# Patient Record
Sex: Male | Born: 1975 | Race: Black or African American | Hispanic: No | Marital: Single | Smoking: Current every day smoker
Health system: Southern US, Community
[De-identification: ages and names within clinical notes are randomized; demographics above are authoritative.]

## PROBLEM LIST (undated history)

## (undated) DIAGNOSIS — M549 Dorsalgia, unspecified: Secondary | ICD-10-CM

## (undated) DIAGNOSIS — F209 Schizophrenia, unspecified: Secondary | ICD-10-CM

---

## 1999-10-31 ENCOUNTER — Encounter: Payer: Self-pay | Admitting: Emergency Medicine

## 1999-10-31 ENCOUNTER — Emergency Department (HOSPITAL_COMMUNITY): Admission: EM | Admit: 1999-10-31 | Discharge: 1999-10-31 | Payer: Self-pay

## 1999-10-31 ENCOUNTER — Emergency Department (HOSPITAL_COMMUNITY): Admission: EM | Admit: 1999-10-31 | Discharge: 1999-10-31 | Payer: Self-pay | Admitting: *Deleted

## 2000-02-08 ENCOUNTER — Emergency Department (HOSPITAL_COMMUNITY): Admission: EM | Admit: 2000-02-08 | Discharge: 2000-02-08 | Payer: Self-pay | Admitting: Emergency Medicine

## 2000-02-10 ENCOUNTER — Emergency Department (HOSPITAL_COMMUNITY): Admission: EM | Admit: 2000-02-10 | Discharge: 2000-02-10 | Payer: Self-pay | Admitting: Emergency Medicine

## 2001-04-25 ENCOUNTER — Encounter: Payer: Self-pay | Admitting: Emergency Medicine

## 2001-04-25 ENCOUNTER — Emergency Department (HOSPITAL_COMMUNITY): Admission: EM | Admit: 2001-04-25 | Discharge: 2001-04-25 | Payer: Self-pay

## 2011-01-02 ENCOUNTER — Emergency Department (HOSPITAL_COMMUNITY)
Admission: EM | Admit: 2011-01-02 | Discharge: 2011-01-03 | Disposition: A | Discharge status: Home / Self Care | Payer: Self-pay | Attending: Emergency Medicine | Admitting: Emergency Medicine

## 2011-01-02 DIAGNOSIS — R369 Urethral discharge, unspecified: Secondary | ICD-10-CM | POA: Insufficient documentation

## 2011-01-02 DIAGNOSIS — L989 Disorder of the skin and subcutaneous tissue, unspecified: Secondary | ICD-10-CM | POA: Insufficient documentation

## 2011-01-02 DIAGNOSIS — Z202 Contact with and (suspected) exposure to infections with a predominantly sexual mode of transmission: Secondary | ICD-10-CM | POA: Insufficient documentation

## 2011-01-04 LAB — GC/CHLAMYDIA PROBE AMP, GENITAL
Chlamydia, DNA Probe: NEGATIVE
GC Probe Amp, Genital: NEGATIVE

## 2011-01-22 ENCOUNTER — Emergency Department (HOSPITAL_COMMUNITY): Payer: Self-pay

## 2011-01-22 ENCOUNTER — Emergency Department (HOSPITAL_COMMUNITY)
Admission: EM | Admit: 2011-01-22 | Discharge: 2011-01-22 | Disposition: A | Discharge status: Home / Self Care | Payer: Self-pay | Attending: Emergency Medicine | Admitting: Emergency Medicine

## 2011-01-22 DIAGNOSIS — IMO0001 Reserved for inherently not codable concepts without codable children: Secondary | ICD-10-CM | POA: Insufficient documentation

## 2011-01-22 DIAGNOSIS — J45909 Unspecified asthma, uncomplicated: Secondary | ICD-10-CM | POA: Insufficient documentation

## 2011-01-22 DIAGNOSIS — K5900 Constipation, unspecified: Secondary | ICD-10-CM | POA: Insufficient documentation

## 2011-01-22 DIAGNOSIS — R109 Unspecified abdominal pain: Secondary | ICD-10-CM | POA: Insufficient documentation

## 2011-01-22 LAB — URINALYSIS, ROUTINE W REFLEX MICROSCOPIC
Bilirubin Urine: NEGATIVE
Glucose, UA: NEGATIVE mg/dL
Ketones, ur: NEGATIVE mg/dL
Leukocytes, UA: NEGATIVE
Nitrite: NEGATIVE
Protein, ur: NEGATIVE mg/dL
Specific Gravity, Urine: 1.029 (ref 1.005–1.030)
Urobilinogen, UA: 0.2 mg/dL (ref 0.0–1.0)
pH: 5 (ref 5.0–8.0)

## 2011-01-22 LAB — URINE MICROSCOPIC-ADD ON

## 2011-01-22 MED ORDER — CYCLOBENZAPRINE HCL 10 MG PO TABS: 10.0000 mg | ORAL_TABLET | Freq: Two times a day (BID) | ORAL | Status: AC | PRN

## 2011-01-22 MED ORDER — HYDROCODONE-ACETAMINOPHEN 5-325 MG PO TABS
2.0000 | ORAL_TABLET | Freq: Once | ORAL | Status: AC
  Administered 2011-01-22: 2 via ORAL
  Filled 2011-01-22: qty 2

## 2011-01-22 MED ORDER — DOCUSATE SODIUM 100 MG PO CAPS: 100.0000 mg | ORAL_CAPSULE | Freq: Two times a day (BID) | ORAL | Status: AC

## 2011-01-22 NOTE — ED Notes (Signed)
Received patient and assumed care, pt presents with c/o left flank pain for three days, rates pain 10/10 radiating to left hip and groin area, pt medicated as order pending ct scan

## 2011-01-22 NOTE — ED Notes (Signed)
Patient presents with left flank pain and pressure x 3 days with genital pain. Patient denies urinary symptoms. Patient reports pain is worse with coughing.

## 2011-01-22 NOTE — ED Provider Notes (Signed)
History     CSN: [id number] Arrival date & time: 01/22/2011  9:39 AM   First MD Initiated Contact with Patient 01/22/11 1025      Chief Complaint  Patient presents with  . Flank Pain    (Consider location/radiation/quality/duration/timing/severity/associated sxs/prior treatment) Patient is a 35 y.o. male presenting with flank pain. The history is provided by the patient.  Flank Pain This is a new problem. The current episode started in the past 7 days. The problem occurs 2 to 4 times per day. The problem has been unchanged. Associated symptoms include myalgias. Pertinent negatives include no abdominal pain, arthralgias, change in bowel habit, chest pain, chills, coughing, fatigue, fever, joint swelling, nausea, rash, sore throat, urinary symptoms, vertigo, visual change, vomiting or weakness. The symptoms are aggravated by coughing. He has tried acetaminophen for the symptoms. The treatment provided mild relief.  In the last 3 days pt has had flank pain and some L groin pain that seems to spasm at times. He states that he had penile discharge 3 weeks ago that was treated and has not recurred. He denies frequency, urgency, pain with urination, blood in urine, no change in bowel habits. Pain is worse with coughing at times. States he has had decreased urine because he is not drinking much liquids during the day. No fevers, chills, abdominal pain, nausea, vomiting. No bulge in his groin with pain. No history of hernia. No history of back pain.   Past Medical History  Diagnosis Date  . Asthma     History reviewed. No pertinent past surgical history.  History reviewed. No pertinent family history.  History  Substance Use Topics  . Smoking status: Current Everyday Smoker  . Smokeless tobacco: Not on file  . Alcohol Use: Yes      Review of Systems  Constitutional: Negative for fever, chills, activity change, appetite change and fatigue.  HENT: Negative.  Negative for sore throat.     Respiratory: Negative for cough and shortness of breath.   Cardiovascular: Negative for chest pain, palpitations and leg swelling.  Gastrointestinal: Negative for nausea, vomiting, abdominal pain, diarrhea, constipation, blood in stool, abdominal distention and change in bowel habit.  Genitourinary: Positive for flank pain. Negative for dysuria, urgency, frequency, hematuria, decreased urine volume, discharge, penile swelling, scrotal swelling, enuresis, difficulty urinating, genital sores, penile pain and testicular pain.  Musculoskeletal: Positive for myalgias and back pain. Negative for joint swelling, arthralgias and gait problem.       Pt does not have tenderness of the spine, some flank pain on the L side. No signs of hernia on the L or the R side.   Skin: Negative for color change, rash and wound.  Neurological: Negative.  Negative for vertigo and weakness.  Hematological: Negative.   Psychiatric/Behavioral: Negative.   All other systems reviewed and are negative.    Allergies  Review of patient's allergies indicates no known allergies.  Home Medications  No current outpatient prescriptions on file.  BP 134/66  Pulse 106  Temp(Src) 97.9 F (36.6 C) (Oral)  Resp 20  SpO2 96%  Physical Exam  Vitals reviewed. Constitutional: He is oriented to person, place, and time. He appears well-developed and well-nourished.       In mild distress.  HENT:  Head: Normocephalic and atraumatic.  Eyes: EOM are normal. Pupils are equal, round, and reactive to light.  Neck: Normal range of motion. Neck supple.  Cardiovascular: Normal rate, regular rhythm and normal heart sounds.   No murmur heard.  Pulmonary/Chest: Effort normal and breath sounds normal. No respiratory distress. He has no wheezes. He has no rales.  Abdominal: Soft. Bowel sounds are normal. He exhibits no distension. There is no tenderness. There is no rebound.  Genitourinary: Penis normal. No penile tenderness.        Inguinal hernia check negative on the L and the R side.   Musculoskeletal: Normal range of motion.       Pt does not have pain to palpation but does have pain to percussion over the L flank. No spinal tenderness.   Neurological: He is alert and oriented to person, place, and time. He displays normal reflexes. No cranial nerve deficit.  Skin: Skin is warm and dry. No rash noted. No erythema. No pallor.    ED Course  Procedures (including critical care time)  Labs Reviewed  URINALYSIS, ROUTINE W REFLEX MICROSCOPIC - Abnormal; Notable for the following:    Hgb urine dipstick SMALL (*)    All other components within normal limits  URINE MICROSCOPIC-ADD ON - Abnormal; Notable for the following:    Bacteria, UA FEW (*)    All other components within normal limits   Ct Abdomen Pelvis Wo Contrast  01/22/2011  *RADIOLOGY REPORT*  Clinical Data: Rule  out kidney stone or inguinal hernia, flank pain  CT ABDOMEN AND PELVIS WITHOUT CONTRAST  Technique:  Multidetector CT imaging of the abdomen and pelvis was performed following the standard protocol without intravenous contrast.  Comparison: None.  Findings: Lung bases are unremarkable.  The sagittal images of the spine are unremarkable.  Unenhanced liver, spleen, pancreas and adrenals are unremarkable.  No calcified gallstones are noted within gallbladder.  Faint subtle medullary calcifications are noted bilateral kidney without obstructive calculus identified.  No hydronephrosis or hydroureter.  No calcified ureteral calculi. Moderate stool noted throughout the colon.  Abdominal aorta is unremarkable.  No small bowel obstruction.  No ascites or free air.  No adenopathy.  There is no pericecal inflammation.  Normal appendix is clearly visualized axial image 73.  Urinary bladder is unremarkable.  Bilateral distal ureter is unremarkable.  Prostate gland seminal vesicles are unremarkable.  No pelvic ascites or adenopathy.  No inguinal adenopathy.  Small  nonspecific bilateral inguinal lymph nodes are noted. No destructive bony lesions are noted within pelvis. No evidence of inguinal hernia. Pelvic phleboliths are noted.  IMPRESSION:  1.  Faint bilateral kidney medullary region calcifications are suggested without obstructive calcified renal calculi.  Subtle medullary nephrocalcinosis cannot be excluded. 2.  No hydronephrosis or hydroureter.  No calcified ureteral calculi are noted. 3.  Moderate stool noted throughout the colon.  No pericecal inflammation.  Normal appendix. 4.  Unremarkable urinary bladder.  Original Report Authenticated By: Natasha Mead, M.D.     1. Flank pain   2. Constipation       MDM  Etiology likely to be kidney stone, the urine was positive for RBCs. Will get CT abd/pelvis to rule out any kidney stone or hernia. No hernia noted on physical exam but clinical story is suspicious. Also, decreased fluid intake could be risk factor for stone precipitation. Will medicate for pain.      12:11 PM Discussed results of CT scan with the pt and told him that likely etiology is muscle spasm or constipation. Instructed him on medication use of muscle relaxant and colace. Told him when to come back and to stop taking colace if he is having diarrhea. Pt verbalized understanding and agreement with the plan.  Genella Mech, MD 01/22/11 1212

## 2011-01-22 NOTE — ED Provider Notes (Signed)
I saw and evaluated the patient, reviewed the resident's note and I agree with the findings and plan.     Laray Anger, DO 01/22/11 Nicholos Johns

## 2011-10-18 ENCOUNTER — Emergency Department (HOSPITAL_COMMUNITY)
Admission: EM | Admit: 2011-10-18 | Discharge: 2011-10-21 | Disposition: A | Discharge status: Home / Self Care | Payer: Self-pay

## 2011-10-18 ENCOUNTER — Encounter (HOSPITAL_COMMUNITY): Payer: Self-pay | Admitting: Adult Health

## 2011-10-18 DIAGNOSIS — J45909 Unspecified asthma, uncomplicated: Secondary | ICD-10-CM | POA: Insufficient documentation

## 2011-10-18 DIAGNOSIS — F3289 Other specified depressive episodes: Secondary | ICD-10-CM | POA: Insufficient documentation

## 2011-10-18 DIAGNOSIS — F17200 Nicotine dependence, unspecified, uncomplicated: Secondary | ICD-10-CM | POA: Insufficient documentation

## 2011-10-18 DIAGNOSIS — F329 Major depressive disorder, single episode, unspecified: Secondary | ICD-10-CM | POA: Insufficient documentation

## 2011-10-18 DIAGNOSIS — R443 Hallucinations, unspecified: Secondary | ICD-10-CM | POA: Insufficient documentation

## 2011-10-18 DIAGNOSIS — F1410 Cocaine abuse, uncomplicated: Secondary | ICD-10-CM | POA: Insufficient documentation

## 2011-10-18 DIAGNOSIS — Z8659 Personal history of other mental and behavioral disorders: Secondary | ICD-10-CM | POA: Insufficient documentation

## 2011-10-18 DIAGNOSIS — F32A Depression, unspecified: Secondary | ICD-10-CM

## 2011-10-18 HISTORY — DX: Schizophrenia, unspecified: F20.9

## 2011-10-18 LAB — CBC
HCT: 45.7 % (ref 39.0–52.0)
Hemoglobin: 15.9 g/dL (ref 13.0–17.0)
RBC: 5.5 MIL/uL (ref 4.22–5.81)

## 2011-10-18 LAB — COMPREHENSIVE METABOLIC PANEL
ALT: 20 U/L (ref 0–53)
Alkaline Phosphatase: 65 U/L (ref 39–117)
BUN: 11 mg/dL (ref 6–23)
CO2: 23 mEq/L (ref 19–32)
Chloride: 105 mEq/L (ref 96–112)
GFR calc Af Amer: 71 mL/min — ABNORMAL LOW (ref >90)
GFR calc non Af Amer: 62 mL/min — ABNORMAL LOW (ref >90)
Glucose, Bld: 112 mg/dL — ABNORMAL HIGH (ref 70–99)
Potassium: 3.9 mEq/L (ref 3.5–5.1)
Sodium: 142 mEq/L (ref 135–145)
Total Bilirubin: 0.4 mg/dL (ref 0.3–1.2)

## 2011-10-18 LAB — RAPID URINE DRUG SCREEN, HOSP PERFORMED
Barbiturates: NOT DETECTED
Cocaine: POSITIVE — AB
Tetrahydrocannabinol: NOT DETECTED

## 2011-10-18 LAB — ETHANOL: Alcohol, Ethyl (B): <11 mg/dL (ref 0–11)

## 2011-10-18 MED ORDER — ONDANSETRON HCL 8 MG PO TABS: 4.0000 mg | ORAL_TABLET | Freq: Three times a day (TID) | ORAL | Status: DC | PRN

## 2011-10-18 MED ORDER — ALUM & MAG HYDROXIDE-SIMETH 200-200-20 MG/5ML PO SUSP
30.0000 mL | ORAL | Status: DC | PRN
  Administered 2011-10-21: 30 mL via ORAL
  Filled 2011-10-18: qty 30

## 2011-10-18 MED ORDER — IBUPROFEN 200 MG PO TABS
600.0000 mg | ORAL_TABLET | Freq: Three times a day (TID) | ORAL | Status: DC | PRN
  Administered 2011-10-19 – 2011-10-21 (×4): 600 mg via ORAL
  Filled 2011-10-18: qty 3
  Filled 2011-10-18: qty 1
  Filled 2011-10-18: qty 3
  Filled 2011-10-18: qty 2
  Filled 2011-10-18: qty 3

## 2011-10-18 MED ORDER — LORAZEPAM 1 MG PO TABS
1.0000 mg | ORAL_TABLET | Freq: Three times a day (TID) | ORAL | Status: DC | PRN
  Administered 2011-10-19 (×2): 1 mg via ORAL
  Filled 2011-10-18 (×2): qty 1

## 2011-10-18 MED ORDER — NICOTINE 21 MG/24HR TD PT24
21.0000 mg | MEDICATED_PATCH | Freq: Every day | TRANSDERMAL | Status: DC
  Administered 2011-10-19 – 2011-10-21 (×3): 21 mg via TRANSDERMAL
  Filled 2011-10-18 (×3): qty 1

## 2011-10-18 NOTE — ED Provider Notes (Signed)
History     CSN: [id number]  Arrival date & time 10/18/11  2110   First MD Initiated Contact with Patient 10/18/11 2339      Chief Complaint  Patient presents with  . V70.1    (Consider location/radiation/quality/duration/timing/severity/associated sxs/prior treatment) HPI Hx from pt. Daniel Downs is a 36 y.o. male with pmh schizophrenia presenting for medical clearance. States he has had suicidal ideation, has been feeling increasingly depressed, and has been hearing voices for the past 2 weeks. States he feels as if the devil is telling him to kill himself. He states he has had thoughts of wanting to hurt other people as well but is unwilling to discuss this further.   Reports has been on a crack binge over the past day; last use at 1400 today. He has not been drinking alcohol today.  Past Medical History  Diagnosis Date  . Asthma   . Schizophrenia     History reviewed. No pertinent past surgical history.  History reviewed. No pertinent family history.  History  Substance Use Topics  . Smoking status: Current Everyday Smoker    Types: Cigarettes  . Smokeless tobacco: Not on file  . Alcohol Use: Yes      Review of Systems  Constitutional: Negative for fever and chills.  HENT: Negative for congestion, sore throat and neck pain.   Eyes: Negative for photophobia and visual disturbance.  Respiratory: Negative for cough and shortness of breath.   Cardiovascular: Negative for chest pain.  Gastrointestinal: Negative for nausea, vomiting and abdominal pain.  Genitourinary: Negative for dysuria.  Musculoskeletal: Negative for myalgias.  Skin: Negative for rash.  Neurological: Negative for dizziness and weakness.  Psychiatric/Behavioral: Positive for hallucinations and dysphoric mood. Negative for behavioral problems, confusion and disturbed wake/sleep cycle. The patient is not nervous/anxious.     Allergies  Review of patient's allergies indicates no known  allergies.  Home Medications  No current outpatient prescriptions on file.  BP 108/78  Pulse 84  Temp 98.1 F (36.7 C) (Oral)  Resp 18  SpO2 97%  Physical Exam  Nursing note and vitals reviewed. Constitutional: He appears well-developed and well-nourished. No distress.  HENT:  Head: Normocephalic and atraumatic.  Mouth/Throat: Oropharynx is clear and moist. No oropharyngeal exudate.  Eyes:       Normal appearance  Neck: Normal range of motion.  Cardiovascular: Normal rate, regular rhythm and normal heart sounds.        Pt was initially tachycardic at triage, pt states he was "keyed up." Normal HR on my exam.  Pulmonary/Chest: Effort normal and breath sounds normal. He exhibits no tenderness.  Abdominal: Soft. Bowel sounds are normal. He exhibits no distension. There is no tenderness. There is no rebound and no guarding.  Musculoskeletal: Normal range of motion.  Neurological: He is alert.  Skin: Skin is warm and dry. He is not diaphoretic.  Psychiatric:       Tangential speech, somewhat manic, not actively hallucinating    ED Course  Procedures (including critical care time)  Labs Reviewed  CBC - Abnormal; Notable for the following:    WBC 14.4 (*)     All other components within normal limits  COMPREHENSIVE METABOLIC PANEL - Abnormal; Notable for the following:    Glucose, Bld 112 (*)     Creatinine, Ser 1.45 (*)     GFR calc non Af Amer 62 (*)     GFR calc Af Amer 71 (*)     All other components within  normal limits  URINE RAPID DRUG SCREEN (HOSP PERFORMED) - Abnormal; Notable for the following:    Cocaine POSITIVE (*)     All other components within normal limits  ETHANOL  ACETAMINOPHEN LEVEL   No results found.   No diagnosis found.    MDM  Pt presents with c/o auditory hallucinations, SI. Medically clear for further eval. Telepsych and holding orders placed. D/W Berna Spare with [facility].       Grant Fontana, PA-C 10/18/11 2358

## 2011-10-18 NOTE — ED Notes (Signed)
Sitter remains at bedside.  Pt states he is having a hard time focusing.

## 2011-10-18 NOTE — ED Notes (Signed)
Patient remembers drinking a bottle of wine and a bottle of alcohol.

## 2011-10-18 NOTE — ED Notes (Signed)
Patient has stated he has been taking cocaine 7am to 2pm. When asked if he has any other substances in his body, he says that is the only that he knows about.

## 2011-10-18 NOTE — ED Notes (Signed)
Spoke with [facility] about Sitter. There is not a sitter available may have to use another pt's sitter.

## 2011-10-18 NOTE — ED Notes (Signed)
All belonging taken and placed in a belongings bag. Pt in blue scrubs. AC notified of sitter need. Pt is cooperative. Security at Bedside to wand.

## 2011-10-18 NOTE — ED Notes (Signed)
pts belongings placed in locker 3.

## 2011-10-18 NOTE — ED Notes (Signed)
Reports SI and hearing voices for the past 2 weeks. States, "Its like the Devil telling me I am a failure and I should kill myself. Then I run to drugs and that stuff doesn't help" Reports crack abuse and cocaine abuse. Last use at 2 pm this afternoon. HR 125. Pt denies HI. C/o chest pain and SOB. Alert and cooperative. Answering all questions appropriately.
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
## 2011-10-19 NOTE — ED Provider Notes (Signed)
Medical screening examination/treatment/procedure(s) were performed by non-physician practitioner and as supervising physician I was immediately available for consultation/collaboration.   Hurman Horn, MD 10/19/11 1710

## 2011-10-19 NOTE — ED Notes (Signed)
Patient called someone and no answer.  That person called back and we allowed him to talk to her for 5 mins.  Patient agreed

## 2011-10-19 NOTE — ED Notes (Signed)
Patient stated he was starting to become anxious.  Also stated he was having back pain.  Rates his back pain 8/10

## 2011-10-19 NOTE — ED Notes (Signed)
Pt slept well overnight.No complaints.Tele psych interview done at 0400 .

## 2011-10-20 NOTE — BH Assessment (Signed)
[facility] Assessment Progress Note      This patient was reassessed to determine current need for placement.  Pt no longer endorses SI or HI "I feel much better than yesterday."  [name] (RN) also verifies that pt's affect is much better, he has better eye contact and is more appropriate in conveying his thoughts.  Pt does admit that he continues to hear command hallucinations "but I know not to act on them."  Pt has walked around the pod numerous times today "cause I am sore and stiff" but has not been agitated or threatening.  Pt has questioned if he can leave, but understands that he remains unsafe if he continues to have command hallucinations telling him to harm himself and/or others.  Pt is agreeable to waiting until the a.m. For a placement disposition or a repeat Telepsych if he continues to improve in symptoms.  Pt did request writing material "it helps when I journal instead of sitting here just listening to voices."  It is recommended that the a.m. Staff re-evaluate pt's level of functioning and his ability to contract for safety if placement is not determined.  Dr and nursing staff notified of continued hold.

## 2011-10-20 NOTE — ED Notes (Signed)
Patient easily arousable.  Sprite given upon request

## 2011-10-20 NOTE — ED Notes (Signed)
Per Irving Burton, [facility] member - patient is safe to have a ink pen and paper to write his thought down.

## 2011-10-21 ENCOUNTER — Encounter (HOSPITAL_COMMUNITY): Payer: Self-pay

## 2011-10-21 LAB — COMPREHENSIVE METABOLIC PANEL
ALT: 17 U/L (ref 0–53)
BUN: 14 mg/dL (ref 6–23)
CO2: 27 mEq/L (ref 19–32)
Calcium: 8.7 mg/dL (ref 8.4–10.5)
Creatinine, Ser: 1.35 mg/dL (ref 0.50–1.35)
GFR calc Af Amer: 78 mL/min — ABNORMAL LOW (ref >90)
GFR calc non Af Amer: 67 mL/min — ABNORMAL LOW (ref >90)
Glucose, Bld: 94 mg/dL (ref 70–99)
Sodium: 139 mEq/L (ref 135–145)
Total Protein: 6.6 g/dL (ref 6.0–8.3)

## 2011-10-21 MED ORDER — DIPHENHYDRAMINE HCL 25 MG PO TABS: 50.0000 mg | ORAL_TABLET | Freq: Every evening | ORAL | Status: DC | PRN

## 2011-10-21 NOTE — ED Provider Notes (Signed)
  Physical Exam  BP 107/61  Pulse 66  Temp 98 F (36.7 C) (Oral)  Resp 20  Ht 6\' 2"  (1.88 m)  Wt 198 lb (89.812 kg)  BMI 25.42 kg/m2  SpO2 99%  Physical Exam  ED Course  Procedures  MDM Patient is feeling somewhat better today. He is awake and appropriate. He states the hallucinations that improved, however he states he still has depression no more suicidal ideations. He is right now under review at the behavioral health hospital. [facility] reportedly wanted another Sinemet which has been done and is still reassuring. Patient may need and no other telemetry psych consult and possible medication recommendations if he was not able to be placed today.     Juliet Rude. Rubin Payor, MD 10/21/11 1256

## 2011-10-21 NOTE — BH Assessment (Signed)
[facility] Assessment Progress Note      Dr requested that patient be rerun by the psychiatrist at behavioral health later today after a repeat cmet has been completed.  Communicated this request with oncoming [facility] and pt's RN.

## 2011-10-21 NOTE — ED Provider Notes (Addendum)
Tele psych reconsulted and Dr. Jacky Kindle talked with patient and felt that he is stable for d/c since he no longer has suicidal ideations. They recommended that he take benadryl at night for sleep. Will d/c patient and will give him psych follow up.   Richardean Canal, MD 10/21/11 2120  Richardean Canal, MD 10/21/11 2125

## 2011-10-21 NOTE — ED Notes (Signed)
Pt d/c home. Ambulatory. Respirations even and regular. Vitals WNL. Verbalized understanding of administration of Benadryl for itching. States he will follow up with psychiatrist and out patient treatment. A.O. X 4. NAD.

## 2011-10-21 NOTE — ED Notes (Signed)
Telepsych in progress. 

## 2011-10-21 NOTE — ED Notes (Addendum)
Pt given a [place] sandwich and coke (placed in paper cup) per request. Environment safe. Sitter at bedside. No further needs at this time.
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
## 2011-10-21 NOTE — ED Notes (Signed)
Pt d/c home. Ambulatory. Vitals stable.

## 2011-10-21 NOTE — ED Notes (Addendum)
Pt denies hearing voices at this times. "I am going home today after the telepsych". Informed that there are currently no orders for discharge. Stated "I am feeling better and when he sees me on the screen he will let me go home." Pt A.O. X 4. NAD.  Pleasant affect. Sitting up in bed watching TV. Family at bedside. No needs expressed at this time. Skin warm dry and intact. Respirations even and regular. Vitals stable. Environment safe. Sitter at bedside. Family at bedside.

## 2011-10-25 ENCOUNTER — Encounter (HOSPITAL_COMMUNITY): Payer: Self-pay | Admitting: *Deleted

## 2011-10-25 ENCOUNTER — Emergency Department (HOSPITAL_COMMUNITY)
Admission: EM | Admit: 2011-10-25 | Discharge: 2011-10-26 | Disposition: A | Discharge status: Home / Self Care | Payer: Self-pay | Attending: Emergency Medicine | Admitting: Emergency Medicine

## 2011-10-25 DIAGNOSIS — F209 Schizophrenia, unspecified: Secondary | ICD-10-CM | POA: Insufficient documentation

## 2011-10-25 LAB — CBC WITH DIFFERENTIAL/PLATELET
Basophils Relative: 0 % (ref 0–1)
HCT: 43.6 % (ref 39.0–52.0)
Hemoglobin: 14.5 g/dL (ref 13.0–17.0)
Lymphocytes Relative: 27 % (ref 12–46)
MCHC: 33.3 g/dL (ref 30.0–36.0)
Monocytes Absolute: 0.6 10*3/uL (ref 0.1–1.0)
Monocytes Relative: 6 % (ref 3–12)
Neutro Abs: 6.8 10*3/uL (ref 1.7–7.7)
Neutrophils Relative %: 65 % (ref 43–77)
RBC: 5.13 MIL/uL (ref 4.22–5.81)
WBC: 10.5 10*3/uL (ref 4.0–10.5)

## 2011-10-25 LAB — BASIC METABOLIC PANEL
BUN: 18 mg/dL (ref 6–23)
CO2: 25 mEq/L (ref 19–32)
Chloride: 101 mEq/L (ref 96–112)
Creatinine, Ser: 1.43 mg/dL — ABNORMAL HIGH (ref 0.50–1.35)
GFR calc Af Amer: 73 mL/min — ABNORMAL LOW (ref >90)
Potassium: 4.4 mEq/L (ref 3.5–5.1)

## 2011-10-25 LAB — ETHANOL: Alcohol, Ethyl (B): <11 mg/dL (ref 0–11)

## 2011-10-25 MED ORDER — LORAZEPAM 1 MG PO TABS
2.0000 mg | ORAL_TABLET | Freq: Once | ORAL | Status: AC
  Administered 2011-10-25: 2 mg via ORAL

## 2011-10-25 MED ORDER — LORAZEPAM 1 MG PO TABS
ORAL_TABLET | ORAL | Status: AC
  Filled 2011-10-25: qty 2
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
## 2011-10-25 NOTE — ED Notes (Signed)
Pt changed into blue scrubs. Security at bedside to clear pt.

## 2011-10-26 ENCOUNTER — Ambulatory Visit (HOSPITAL_COMMUNITY)
Admission: RE | Admit: 2011-10-26 | Discharge: 2011-10-26 | Disposition: A | Discharge status: Home / Self Care | Payer: Self-pay | Attending: Psychiatry | Admitting: Psychiatry

## 2011-10-26 DIAGNOSIS — F1420 Cocaine dependence, uncomplicated: Secondary | ICD-10-CM

## 2011-10-26 DIAGNOSIS — F1994 Other psychoactive substance use, unspecified with psychoactive substance-induced mood disorder: Secondary | ICD-10-CM

## 2011-10-26 DIAGNOSIS — F602 Antisocial personality disorder: Secondary | ICD-10-CM

## 2011-10-26 LAB — RAPID URINE DRUG SCREEN, HOSP PERFORMED
Amphetamines: NOT DETECTED
Barbiturates: NOT DETECTED
Benzodiazepines: NOT DETECTED
Cocaine: NOT DETECTED

## 2011-10-26 MED ORDER — IBUPROFEN 600 MG PO TABS: 600.0000 mg | ORAL_TABLET | Freq: Three times a day (TID) | ORAL | Status: DC | PRN

## 2011-10-26 MED ORDER — LORAZEPAM 1 MG PO TABS
1.0000 mg | ORAL_TABLET | Freq: Three times a day (TID) | ORAL | Status: DC | PRN
  Administered 2011-10-26: 1 mg via ORAL
  Filled 2011-10-26: qty 1

## 2011-10-26 MED ORDER — ONDANSETRON HCL 4 MG PO TABS: 4.0000 mg | ORAL_TABLET | Freq: Three times a day (TID) | ORAL | Status: DC | PRN

## 2011-10-26 MED ORDER — NICOTINE 21 MG/24HR TD PT24
21.0000 mg | MEDICATED_PATCH | Freq: Every day | TRANSDERMAL | Status: DC
  Filled 2011-10-26: qty 1

## 2011-10-26 MED ORDER — ZOLPIDEM TARTRATE 5 MG PO TABS: 5.0000 mg | ORAL_TABLET | Freq: Every evening | ORAL | Status: DC | PRN

## 2011-10-26 MED ORDER — ACETAMINOPHEN 325 MG PO TABS: 650.0000 mg | ORAL_TABLET | ORAL | Status: DC | PRN

## 2011-10-26 MED ORDER — KETOROLAC TROMETHAMINE 60 MG/2ML IM SOLN
60.0000 mg | Freq: Once | INTRAMUSCULAR | Status: AC
  Administered 2011-10-26: 60 mg via INTRAMUSCULAR
  Filled 2011-10-26: qty 2

## 2011-10-26 MED ORDER — ALUM & MAG HYDROXIDE-SIMETH 200-200-20 MG/5ML PO SUSP: 30.0000 mL | ORAL | Status: DC | PRN

## 2011-10-26 NOTE — BHH Counselor (Signed)
Pt presented as a walk in requesting to be admitted after he was just discharged from [facility] to follow up with Mental health. Pt expressed that he had problems with hearing voices & seeing things & needed help with it. Pt stated that no one was taking him serious with his symptoms. Pt denies any ideation & expressed that the last time he was at [facility] he was offered a bed but left AMA. Pt was informed that currently he does not met criteria for inpt & would need to follow up with outpt.
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
## 2011-10-26 NOTE — ED Notes (Signed)
ACT speaking with patient at this time.
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
## 2011-10-26 NOTE — ED Provider Notes (Signed)
History     CSN: [id number]  Arrival date & time 10/25/11  2211   First MD Initiated Contact with Patient 10/26/11 0032      Chief Complaint  Patient presents with  . Medical Clearance    (Consider location/radiation/quality/duration/timing/severity/associated sxs/prior treatment) HPI 36 yo male presents to the ER requesting help with anger issues and hallucinations.  Pt seen on 8/12, had telepsych and cleared for discharge as he was no longer SI.  Pt reports he got upset with his girlfriend and her mother and felt like he was going to murder them.  Pt is having auditory and visual hallucinations.  Pt reports hearing a whisper all the time, telling him "evil" things.  He reports always seeing something in his peripheral vision, but can't see it well.     Past Medical History  Diagnosis Date  . Asthma   . Schizophrenia     History reviewed. No pertinent past surgical history.  No family history on file.  History  Substance Use Topics  . Smoking status: Current Everyday Smoker    Types: Cigarettes  . Smokeless tobacco: Not on file  . Alcohol Use: Yes      Review of Systems  All other systems reviewed and are negative.    Allergies  Review of patient's allergies indicates no known allergies.  Home Medications   Current Outpatient Rx  Name Route Sig Dispense Refill  . DIPHENHYDRAMINE HCL 25 MG PO CAPS Oral Take 50 mg by mouth at bedtime as needed. Sleep.      BP 114/70  Pulse 79  Temp 97.9 F (36.6 C) (Oral)  Resp 18  Wt 198 lb (89.812 kg)  SpO2 99%  Physical Exam  Nursing note and vitals reviewed. Constitutional: He is oriented to person, place, and time. He appears well-developed and well-nourished.  HENT:  Head: Normocephalic and atraumatic.  Nose: Nose normal.  Mouth/Throat: Oropharynx is clear and moist.  Eyes: Conjunctivae and EOM are normal. Pupils are equal, round, and reactive to light.  Neck: Normal range of motion. Neck supple. No JVD  present. No tracheal deviation present. No thyromegaly present.  Cardiovascular: Normal rate, regular rhythm, normal heart sounds and intact distal pulses.  Exam reveals no gallop and no friction rub.   No murmur heard. Pulmonary/Chest: Effort normal and breath sounds normal. No stridor. No respiratory distress. He has no wheezes. He has no rales. He exhibits no tenderness.  Abdominal: Soft. Bowel sounds are normal. He exhibits no distension and no mass. There is no tenderness. There is no rebound and no guarding.  Musculoskeletal: Normal range of motion. He exhibits no edema and no tenderness.  Lymphadenopathy:    He has no cervical adenopathy.  Neurological: He is alert and oriented to person, place, and time. He exhibits normal muscle tone. Coordination normal.  Skin: Skin is warm and dry. No rash noted. No erythema. No pallor.  Psychiatric: His behavior is normal. Judgment and thought content normal.       Bizarre affect    ED Course  Procedures (including critical care time)  Labs Reviewed  BASIC METABOLIC PANEL - Abnormal; Notable for the following:    Creatinine, Ser 1.43 (*)     GFR calc non Af Amer 63 (*)     GFR calc Af Amer 73 (*)     All other components within normal limits  CBC WITH DIFFERENTIAL  ETHANOL  URINE RAPID DRUG SCREEN (HOSP PERFORMED)     1. Schizophrenia  MDM  36 yo male with HI, AH/VH.  Will place in psych hold, pt to be seen by act.        Olivia Mackie, MD 10/26/11 [phone]

## 2011-10-26 NOTE — Consult Note (Signed)
Reason for Consult: Depression, and antisocial personality disorder Referring Physician: Dr. Linzie Collin is an 36 y.o. male.  HPI: Patient was seen and chart reviewed. Patient came to the [facility] long emergency department voluntarily requesting for help for depression. Patient reportedly has been suffering with the unknown depression, sleep disturbance, stressed about relationship, sad, has back pain problem. Patient reported he has been using cocaine, which made him calm and less temper. Patient is reported when he does not use cocaine where he gets forgetful, and cannot maintain relationships, and don't pay his bills. Patient tried to overdose on his crack and ended up getting dehydrated. Patient was charged on several times for breaking and entering, and destroying the property. He was placed in jail at least 5-6 times in last 13 years. Patient was spent in jail for 9 years out of 13 years stayed in [place] for several different charges. Patient reported his mother and the brother lives in [place], [place]. He stays with his girlfriend, and her mother. Patient does not the take psychotropic medications since he has been out of the [place]. Patient does not remember what medication he was taking why he was in jail  He was just out of the jail 2012 and has no followup mental health treatment. Patient denied current suicidal or homicidal ideation, intentions, or plan. Patient has no evidence of psychotic symptoms. patient does not have a history of schizophrenia, paranoia, delusional thoughts or hallucinations without drug of abuse which is cocaine. Patient urine drug screen was negative for drug of abuse at this time.  Past Medical History  Diagnosis Date  . Asthma   . Schizophrenia     History reviewed. No pertinent past surgical history.  No family history on file.  Social History:  reports that he has been smoking Cigarettes.  He does not have any smokeless tobacco history on file. He reports  that he drinks alcohol. He reports that he uses illicit drugs (Cocaine).  Allergies: No Known Allergies  Medications: I have reviewed the patient's current medications.  Results for orders placed during the hospital encounter of 10/25/11 (from the past 48 hour(s))  CBC WITH DIFFERENTIAL     Status: Normal   Collection Time   10/25/11 10:46 PM      Component Value Range Comment   WBC 10.5  4.0 - 10.5 K/uL    RBC 5.13  4.22 - 5.81 MIL/uL    Hemoglobin 14.5  13.0 - 17.0 g/dL    HCT 96.0  45.4 - 09.8 %    MCV 85.0  78.0 - 100.0 fL    MCH 28.3  26.0 - 34.0 pg    MCHC 33.3  30.0 - 36.0 g/dL    RDW 11.9  14.7 - 82.9 %    Platelets 268  150 - 400 K/uL    Neutrophils Relative 65  43 - 77 %    Neutro Abs 6.8  1.7 - 7.7 K/uL    Lymphocytes Relative 27  12 - 46 %    Lymphs Abs 2.9  0.7 - 4.0 K/uL    Monocytes Relative 6  3 - 12 %    Monocytes Absolute 0.6  0.1 - 1.0 K/uL    Eosinophils Relative 2  0 - 5 %    Eosinophils Absolute 0.2  0.0 - 0.7 K/uL    Basophils Relative 0  0 - 1 %    Basophils Absolute 0.0  0.0 - 0.1 K/uL   BASIC METABOLIC PANEL  Status: Abnormal   Collection Time   10/25/11 10:46 PM      Component Value Range Comment   Sodium 138  135 - 145 mEq/L    Potassium 4.4  3.5 - 5.1 mEq/L SLIGHT HEMOLYSIS   Chloride 101  96 - 112 mEq/L    CO2 25  19 - 32 mEq/L    Glucose, Bld 85  70 - 99 mg/dL    BUN 18  6 - 23 mg/dL    Creatinine, Ser 1.61 (*) 0.50 - 1.35 mg/dL    Calcium 9.0  8.4 - 09.6 mg/dL    GFR calc non Af Amer 63 (*) >90 mL/min    GFR calc Af Amer 73 (*) >90 mL/min   ETHANOL     Status: Normal   Collection Time   10/25/11 10:46 PM      Component Value Range Comment   Alcohol, Ethyl (B) <11  0 - 11 mg/dL   URINE RAPID DRUG SCREEN (HOSP PERFORMED)     Status: Normal   Collection Time   10/26/11  1:11 AM      Component Value Range Comment   Opiates NONE DETECTED  NONE DETECTED    Cocaine NONE DETECTED  NONE DETECTED    Benzodiazepines NONE DETECTED  NONE  DETECTED    Amphetamines NONE DETECTED  NONE DETECTED    Tetrahydrocannabinol NONE DETECTED  NONE DETECTED    Barbiturates NONE DETECTED  NONE DETECTED     No results found.  No depression, No anxiety, No psychosis and Positive for anxiety, bad mood, depression, illegal drug usage and antisocial and legal problems. Blood pressure 125/79, pulse 74, temperature 97.6 F (36.4 C), temperature source Oral, resp. rate 14, weight 198 lb (89.812 kg), SpO2 99.00%.   Assessment/Plan: Substance related mood disorder Cocaine dependence. Antisocial personality disorder.  Recommendations: Patient does not meet criteria for acute psychiatric hospitalization. Patient was referred for the outpatient psychiatric services at [facility]. Patient was not on psychotropic medications during this or emergency stay.  Jonpaul Lumm,JANARDHAHA R. 10/26/2011, 5:01 PM

## 2011-10-31 ENCOUNTER — Encounter (HOSPITAL_COMMUNITY): Payer: Self-pay | Admitting: Emergency Medicine

## 2011-10-31 ENCOUNTER — Emergency Department (HOSPITAL_COMMUNITY)
Admission: EM | Admit: 2011-10-31 | Discharge: 2011-10-31 | Disposition: A | Discharge status: Home / Self Care | Payer: Self-pay | Attending: Emergency Medicine | Admitting: Emergency Medicine

## 2011-10-31 DIAGNOSIS — F209 Schizophrenia, unspecified: Secondary | ICD-10-CM | POA: Insufficient documentation

## 2011-10-31 DIAGNOSIS — M5416 Radiculopathy, lumbar region: Secondary | ICD-10-CM

## 2011-10-31 DIAGNOSIS — F17200 Nicotine dependence, unspecified, uncomplicated: Secondary | ICD-10-CM | POA: Insufficient documentation

## 2011-10-31 DIAGNOSIS — IMO0002 Reserved for concepts with insufficient information to code with codable children: Secondary | ICD-10-CM | POA: Insufficient documentation

## 2011-10-31 MED ORDER — OXYCODONE-ACETAMINOPHEN 5-325 MG PO TABS: 1.0000 | ORAL_TABLET | Freq: Four times a day (QID) | ORAL | Status: DC | PRN

## 2011-10-31 MED ORDER — CYCLOBENZAPRINE HCL 10 MG PO TABS: 10.0000 mg | ORAL_TABLET | Freq: Two times a day (BID) | ORAL | Status: DC | PRN

## 2011-10-31 MED ORDER — HYDROMORPHONE HCL PF 1 MG/ML IJ SOLN
1.0000 mg | Freq: Once | INTRAMUSCULAR | Status: AC
  Administered 2011-10-31: 1 mg via INTRAMUSCULAR
  Filled 2011-10-31: qty 1

## 2011-10-31 MED ORDER — METHYLPREDNISOLONE 4 MG PO KIT: PACK | ORAL | Status: DC

## 2011-10-31 MED ORDER — KETOROLAC TROMETHAMINE 60 MG/2ML IM SOLN
60.0000 mg | Freq: Once | INTRAMUSCULAR | Status: AC
  Administered 2011-10-31: 60 mg via INTRAMUSCULAR
  Filled 2011-10-31: qty 2

## 2011-10-31 NOTE — ED Notes (Signed)
Pt c/o right leg pain starting in lower back with radiation down right leg; pt sts pain and swelling in right testicle x 4 days

## 2011-10-31 NOTE — ED Provider Notes (Addendum)
History  Scribed for Daniel Sprout, MD, the patient was seen in room TR11C/TR11C. This chart was scribed by Candelaria Stagers. The patient's care started at 4:43 PM   CSN: [id number]  Arrival date & time 10/31/11  1344   First MD Initiated Contact with Patient 10/31/11 1633      Chief Complaint  Patient presents with  . Leg Pain  . Groin Pain     Patient is a 36 y.o. male presenting with groin pain. The history is provided by the patient. No language interpreter was used.  Groin Pain Associated symptoms include abdominal pain.   [name] is a 36 y.o. male who presents to the Emergency Department complaining of right leg pain with intermittent spasms that radiates from his lower back that started about one week ago.  He is also experiencing tingling in the toes.  He has taken flexeril with no relief.   Standing and sitting makes the pain worse.  Pt reports he is also experiencing right testicular swelling that started four days ago.   Past Medical History  Diagnosis Date  . Asthma   . Schizophrenia     History reviewed. No pertinent past surgical history.  History reviewed. No pertinent family history.  History  Substance Use Topics  . Smoking status: Current Everyday Smoker    Types: Cigarettes  . Smokeless tobacco: Not on file  . Alcohol Use: Yes      Review of Systems  Gastrointestinal: Positive for abdominal pain.  Genitourinary: Positive for scrotal swelling. Negative for difficulty urinating.  Musculoskeletal: Positive for back pain and arthralgias (right leg pain ).  All other systems reviewed and are negative.    Allergies  Review of patient's allergies indicates no known allergies.  Home Medications  No current outpatient prescriptions on file.  BP 119/76  Pulse 80  Temp 97.6 F (36.4 C) (Oral)  Resp 18  SpO2 100%  Physical Exam  Nursing note and vitals reviewed. Constitutional: He is oriented to person, place, and time. He appears  well-developed and well-nourished. No distress.  HENT:  Head: Normocephalic and atraumatic.  Eyes: Pupils are equal, round, and reactive to light. Right eye exhibits no discharge. Left eye exhibits no discharge.  Neck: Normal range of motion.  Cardiovascular: Normal rate.   Pulmonary/Chest: Effort normal. No respiratory distress.  Abdominal: Soft. He exhibits no distension. There is tenderness.  Genitourinary:       Right testicle normal upon examination.   Musculoskeletal:       Tenderness of the right para lumbar spine.  Negative straight leg raise.  Normal pulse and sensation.    Neurological: He is alert and oriented to person, place, and time.  Skin: Skin is warm and dry.  Psychiatric: He has a normal mood and affect. His behavior is normal.    ED Course  Procedures   DIAGNOSTIC STUDIES: Oxygen Saturation is 100% on room air, normal by my interpretation.    COORDINATION OF CARE:     Labs Reviewed - No data to display No results found.   1. Lumbar radiculopathy       MDM   Pt with gradual onset of back pain suggestive of radiculopathy.  No neurovascular compromise and no incontinence.  Pt has no infectious sx, hx of CA  or other red flags concerning for pathologic back pain.  Pt is able to ambulate but is painful.  Normal strength and reflexes on exam.  Denies trauma. Will give pt pain control and to  return for developement of above sx.  I personally performed the services described in this documentation, which was scribed in my presence.  The recorded information has been reviewed and considered.        Daniel Sprout, MD 10/31/11 1706  Daniel Sprout, MD 10/31/11 1708

## 2011-11-04 ENCOUNTER — Emergency Department (HOSPITAL_COMMUNITY)
Admission: EM | Admit: 2011-11-04 | Discharge: 2011-11-04 | Disposition: A | Discharge status: Home / Self Care | Payer: Self-pay | Attending: Emergency Medicine | Admitting: Emergency Medicine

## 2011-11-04 ENCOUNTER — Emergency Department (HOSPITAL_COMMUNITY): Payer: Self-pay

## 2011-11-04 ENCOUNTER — Encounter (HOSPITAL_COMMUNITY): Payer: Self-pay | Admitting: *Deleted

## 2011-11-04 DIAGNOSIS — M545 Low back pain, unspecified: Secondary | ICD-10-CM | POA: Insufficient documentation

## 2011-11-04 DIAGNOSIS — M533 Sacrococcygeal disorders, not elsewhere classified: Secondary | ICD-10-CM | POA: Insufficient documentation

## 2011-11-04 DIAGNOSIS — F209 Schizophrenia, unspecified: Secondary | ICD-10-CM | POA: Insufficient documentation

## 2011-11-04 DIAGNOSIS — F17200 Nicotine dependence, unspecified, uncomplicated: Secondary | ICD-10-CM | POA: Insufficient documentation

## 2011-11-04 DIAGNOSIS — J45909 Unspecified asthma, uncomplicated: Secondary | ICD-10-CM | POA: Insufficient documentation

## 2011-11-04 MED ORDER — KETOROLAC TROMETHAMINE 60 MG/2ML IM SOLN
60.0000 mg | Freq: Once | INTRAMUSCULAR | Status: AC
  Administered 2011-11-04: 60 mg via INTRAMUSCULAR
  Filled 2011-11-04: qty 2

## 2011-11-04 MED ORDER — PREDNISONE 20 MG PO TABS: ORAL_TABLET | ORAL | Status: DC

## 2011-11-04 MED ORDER — CYCLOBENZAPRINE HCL 10 MG PO TABS: 10.0000 mg | ORAL_TABLET | Freq: Three times a day (TID) | ORAL | Status: DC | PRN

## 2011-11-04 MED ORDER — DIAZEPAM 5 MG/ML IJ SOLN
5.0000 mg | Freq: Once | INTRAMUSCULAR | Status: AC
  Administered 2011-11-04: 5 mg via INTRAMUSCULAR
  Filled 2011-11-04: qty 2

## 2011-11-04 MED ORDER — TRAMADOL-ACETAMINOPHEN 37.5-325 MG PO TABS: ORAL_TABLET | ORAL | Status: DC

## 2011-11-04 NOTE — ED Notes (Signed)
Patient transported to X-ray 

## 2011-11-04 NOTE — Discharge Instructions (Signed)
Try heat on your back. Take the medications as prescribed. You can also take ibuprofen 600 mg 4 times a day for pain. You can be rechecked at [facility] if you continue to have pain in your back.   Back Exercises Back exercises help treat and prevent back injuries. The goal of back exercises is to increase the strength of your abdominal and back muscles and the flexibility of your back. These exercises should be started when you no longer have back pain. Back exercises include:  Pelvic Tilt. Lie on your back with your knees bent. Tilt your pelvis until the lower part of your back is against the floor. Hold this position 5 to 10 sec and repeat 5 to 10 times.   Knee to Chest. Pull first 1 knee up against your chest and hold for 20 to 30 seconds, repeat this with the other knee, and then both knees. This may be done with the other leg straight or bent, whichever feels better.   Sit-Ups or Curl-Ups. Bend your knees 90 degrees. Start with tilting your pelvis, and do a partial, slow sit-up, lifting your trunk only 30 to 45 degrees off the floor. Take at least 2 to 3 seconds for each sit-up. Do not do sit-ups with your knees out straight. If partial sit-ups are difficult, simply do the above but with only tightening your abdominal muscles and holding it as directed.   Hip-Lift. Lie on your back with your knees flexed 90 degrees. Push down with your feet and shoulders as you raise your hips a couple inches off the floor; hold for 10 seconds, repeat 5 to 10 times.   Back arches. Lie on your stomach, propping yourself up on bent elbows. Slowly press on your hands, causing an arch in your low back. Repeat 3 to 5 times. Any initial stiffness and discomfort should lessen with repetition over time.   Shoulder-Lifts. Lie face down with arms beside your body. Keep hips and torso pressed to floor as you slowly lift your head and shoulders off the floor.  Do not overdo your exercises, especially in the  beginning. Exercises may cause you some mild back discomfort which lasts for a few minutes; however, if the pain is more severe, or lasts for more than 15 minutes, do not continue exercises until you see your caregiver. Improvement with exercise therapy for back problems is slow.  See your caregivers for assistance with developing a proper back exercise program. Document Released: 04/01/2004 Document Revised: 02/11/2011 Document Reviewed: 02/22/2005 [facility] Patient Information 2012 [place], [place].Back Pain, Adult Low back pain is very common. About 1 in 5 people have back pain.The cause of low back pain is rarely dangerous. The pain often gets better over time.About half of people with a sudden onset of back pain feel better in just 2 weeks. About 8 in 10 people feel better by 6 weeks.  CAUSES Some common causes of back pain include:  Strain of the muscles or ligaments supporting the spine.   Wear and tear (degeneration) of the spinal discs.   Arthritis.   Direct injury to the back.  DIAGNOSIS Most of the time, the direct cause of low back pain is not known.However, back pain can be treated effectively even when the exact cause of the pain is unknown.Answering your caregiver's questions about your overall health and symptoms is one of the most accurate ways to make sure the cause of your pain is not dangerous. If your caregiver needs more  information, he or she may order lab work or imaging tests (X-rays or MRIs).However, even if imaging tests show changes in your back, this usually does not require surgery. HOME CARE INSTRUCTIONS For many people, back pain returns.Since low back pain is rarely dangerous, it is often a condition that people can learn to [facility] their own.   Remain active. It is stressful on the back to sit or stand in one place. Do not sit, drive, or stand in one place for more than 30 minutes at a time. Take short walks on level surfaces as soon as pain allows.Try to  increase the length of time you walk each day.   Do not stay in bed.Resting more than 1 or 2 days can delay your recovery.   Do not avoid exercise or work.Your body is made to move.It is not dangerous to be active, even though your back may hurt.Your back will likely heal faster if you return to being active before your pain is gone.   Pay attention to your body when you bend and lift. Many people have less discomfortwhen lifting if they bend their knees, keep the load close to their bodies,and avoid twisting. Often, the most comfortable positions are those that put less stress on your recovering back.   Find a comfortable position to sleep. Use a firm mattress and lie on your side with your knees slightly bent. If you lie on your back, put a pillow under your knees.   Only take over-the-counter or prescription medicines as directed by your caregiver. Over-the-counter medicines to reduce pain and inflammation are often the most helpful.Your caregiver may prescribe muscle relaxant drugs.These medicines help dull your pain so you can more quickly return to your normal activities and healthy exercise.   Put ice on the injured area.   Put ice in a plastic bag.   Place a towel between your skin and the bag.   Leave the ice on for 15 to 20 minutes, 3 to 4 times a day for the first 2 to 3 days. After that, ice and heat may be alternated to reduce pain and spasms.   Ask your caregiver about trying back exercises and gentle massage. This may be of some benefit.   Avoid feeling anxious or stressed.Stress increases muscle tension and can worsen back pain.It is important to recognize when you are anxious or stressed and learn ways to manage it.Exercise is a great option.  SEEK MEDICAL CARE IF:  You have pain that is not relieved with rest or medicine.   You have pain that does not improve in 1 week.   You have new symptoms.   You are generally not feeling well.  SEEK IMMEDIATE MEDICAL  CARE IF:   You have pain that radiates from your back into your legs.   You develop new bowel or bladder control problems.   You have unusual weakness or numbness in your arms or legs.   You develop nausea or vomiting.   You develop abdominal pain.   You feel faint.  Document Released: 02/22/2005 Document Revised: 02/11/2011 Document Reviewed: 07/13/2010 [facility] Patient Information 2012 [place], [place].

## 2011-11-04 NOTE — ED Provider Notes (Signed)
History  Scribed for Ward Givens, MD, the patient was seen in room TR05C/TR05C. This chart was scribed by Candelaria Stagers. The patient's care started at 12:03 PM   Both ey  CSN: [id number]  Arrival date & time 11/04/11  0900   First MD Initiated Contact with Patient 11/04/11 1130      Chief Complaint  Patient presents with  . Back Pain     The history is provided by the patient. No language interpreter was used.   Daniel Downs is a 36 y.o. male who presents to the Emergency Department complaining of continued chronic lower back pain that has gotten worse over the last two weeks.  Pt states that walking and sitting make the pain worse.  Lying still makes the pain better.  He is also experiencing radiating pain down his right leg into his four toes for the past week. Pt also describes spasms in his right thigh.  Pt was given pain medications four day ago which have provided little relief, although he states when he takes the percocet "I can run down the street and feel fine, but I pay for it later".   Pt reports he was released from prison one year ago and currently works as a Education administrator.    PCP none  Past Medical History  Diagnosis Date  . Asthma   . Schizophrenia     History reviewed. No pertinent past surgical history.  History reviewed. No pertinent family history.  History  Substance Use Topics  . Smoking status: Current Everyday Smoker    Types: Cigarettes  . Smokeless tobacco: Not on file  . Alcohol Use: Yes   Employed   Review of Systems  Musculoskeletal: Positive for back pain and arthralgias (right leg pain).  All other systems reviewed and are negative.    Allergies  Review of patient's allergies indicates no known allergies.  Home Medications   Current Outpatient Rx  Name Route Sig Dispense Refill  . CYCLOBENZAPRINE HCL 10 MG PO TABS Oral Take 10 mg by mouth 2 (two) times daily as needed. As needed for muscle spasms.    . METHYLPREDNISOLONE 4 MG PO  KIT Oral Take 4 mg by mouth daily. 6 day course.    . OXYCODONE-ACETAMINOPHEN 5-325 MG PO TABS Oral Take 1-2 tablets by mouth every 6 (six) hours as needed.      BP 105/74  Pulse 119  Temp 98.3 F (36.8 C) (Oral)  Resp 20  SpO2 98%  Vital signs normal except tachycardia   Physical Exam  Nursing note and vitals reviewed. Constitutional: He is oriented to person, place, and time. He appears well-developed and well-nourished. No distress.       Appears uncomfortably.  Changing positions.  Having a hard time sitting still.   HENT:  Head: Normocephalic and atraumatic.  Right Ear: External ear normal.  Left Ear: External ear normal.  Eyes: Conjunctivae and EOM are normal. Pupils are equal, round, and reactive to light.  Neck: Normal range of motion. Neck supple.  Pulmonary/Chest: Effort normal. No respiratory distress.  Musculoskeletal: He exhibits tenderness. He exhibits no edema.       Patellar reflex 1+ and equal.  Straight leg raising on the right causing pain in the thigh. Left straight leg raise causes pain in his lower lumbar spine.  Tenderness over SI joint on the right side.  No pain on ROM of the waist.   Neurological: He is alert and oriented to person, place, and time.  Skin: Skin is warm and dry. He is not diaphoretic.  Psychiatric: He has a normal mood and affect. His behavior is normal.    ED Course  Procedures    Medications  cyclobenzaprine (FLEXERIL) 10 MG tablet (not administered)  ketorolac (TORADOL) injection 60 mg (60 mg Intramuscular Given 11/04/11 1216)  diazepam (VALIUM) injection 5 mg (5 mg Intramuscular Given 11/04/11 1217)     DIAGNOSTIC STUDIES: Oxygen Saturation is 98% on room air, normal by my interpretation.    COORDINATION OF CARE:  12:12 Ordered: DG Lumbar Spine Complete  Pt sleeping at time of discharge   Dg Lumbar Spine Complete  11/04/2011  *RADIOLOGY REPORT*  Clinical Data: Low back pain  LUMBAR SPINE - COMPLETE 4+ VIEW  Comparison:  CT 01/22/2011  Findings: Normal alignment of the lumbar vertebral bodies.  No loss vertebral body height or disc height.  No subluxation.  Oblique projections demonstrate no pars fracture.  IMPRESSION: No acute findings lumbar spine.  No change from prior.   Original Report Authenticated By: Genevive Bi, M.D.      1. Back pain, lumbosacral    Patient's Medications  New Prescriptions   CYCLOBENZAPRINE (FLEXERIL) 10 MG TABLET    Take 1 tablet (10 mg total) by mouth 3 (three) times daily as needed for muscle spasms.   PREDNISONE (DELTASONE) 20 MG TABLET    Take 3 po QD x 3d , then 2 po QD x 3d then 1 po QD x 3d   TRAMADOL-ACETAMINOPHEN (ULTRACET) 37.5-325 MG PER TABLET    2 tabs po QID prn pain    Plan discharge  Devoria Albe, MD, FACEP    MDM   I personally performed the services described in this documentation, which was scribed in my presence. The recorded information has been reviewed and considered.  Devoria Albe, MD, FACEP         Ward Givens, MD 11/04/11 [phone]

## 2011-11-04 NOTE — ED Notes (Signed)
Pt reports continued lower back pain, no relief with meds already rx'd to him. Pt report pain travels down leg, reports unalbe to sit or lay correctly, this has been going on for approx 2 weeks.

## 2011-11-11 ENCOUNTER — Encounter (HOSPITAL_COMMUNITY): Payer: Self-pay | Admitting: Emergency Medicine

## 2011-11-11 ENCOUNTER — Emergency Department (HOSPITAL_COMMUNITY)
Admission: EM | Admit: 2011-11-11 | Discharge: 2011-11-11 | Disposition: A | Discharge status: Home / Self Care | Payer: Self-pay | Attending: Emergency Medicine | Admitting: Emergency Medicine

## 2011-11-11 DIAGNOSIS — M545 Low back pain, unspecified: Secondary | ICD-10-CM | POA: Insufficient documentation

## 2011-11-11 DIAGNOSIS — M549 Dorsalgia, unspecified: Secondary | ICD-10-CM

## 2011-11-11 DIAGNOSIS — M79609 Pain in unspecified limb: Secondary | ICD-10-CM | POA: Insufficient documentation

## 2011-11-11 DIAGNOSIS — F209 Schizophrenia, unspecified: Secondary | ICD-10-CM | POA: Insufficient documentation

## 2011-11-11 DIAGNOSIS — F17200 Nicotine dependence, unspecified, uncomplicated: Secondary | ICD-10-CM | POA: Insufficient documentation

## 2011-11-11 MED ORDER — DIAZEPAM 5 MG PO TABS: 5.0000 mg | ORAL_TABLET | Freq: Two times a day (BID) | ORAL | Status: AC

## 2011-11-11 NOTE — ED Provider Notes (Signed)
History   Scribed for Toy Baker, MD, the patient was seen in room TR11C/TR11C . This chart was scribed by Lewanda Rife.    CSN: [id number]  Arrival date & time 11/11/11  1055   First MD Initiated Contact with Patient 11/11/11 1129      Chief Complaint  Patient presents with  . Leg Pain  . Back Pain    (Consider location/radiation/quality/duration/timing/severity/associated sxs/prior treatment) HPI Daniel Downs is a 36 y.o. male who presents to the Emergency Department complaining of severe constant right lower back pain for the past 3 weeks. Pt describes the pain as sharp. Pt states pain medications are not alleviating symptoms.  Pt denies dysuria and difficulty with bowel movements. Pt was seen here in the ED for the same on August on the 25th and 29th x-ray exams were completed with no acute findings.  Past Medical History  Diagnosis Date  . Asthma   . Schizophrenia     History reviewed. No pertinent past surgical history.  History reviewed. No pertinent family history.  History  Substance Use Topics  . Smoking status: Current Everyday Smoker    Types: Cigarettes  . Smokeless tobacco: Not on file  . Alcohol Use: Yes      Review of Systems  Constitutional: Negative.   HENT: Negative.   Respiratory: Negative.   Cardiovascular: Negative.   Gastrointestinal: Negative.   Musculoskeletal: Positive for back pain.  Skin: Negative.   Neurological: Negative.   Hematological: Negative.   Psychiatric/Behavioral: Negative.   All other systems reviewed and are negative.    Allergies  Review of patient's allergies indicates no known allergies.  Home Medications   Current Outpatient Rx  Name Route Sig Dispense Refill  . CYCLOBENZAPRINE HCL 10 MG PO TABS Oral Take 10 mg by mouth 3 (three) times daily as needed. Muscle spasms    . PREDNISONE 20 MG PO TABS Oral Take 10-30 mg by mouth daily. Take 3 tablets every day for 3 days, then 2 every day for 3 days, and  1 tablet every day for 3 days.    . TRAMADOL-ACETAMINOPHEN 37.5-325 MG PO TABS Oral Take 2 tablets by mouth 4 (four) times daily as needed. For pain    . CYCLOBENZAPRINE HCL 10 MG PO TABS Oral Take 10 mg by mouth 2 (two) times daily as needed. As needed for muscle spasms.    . OXYCODONE-ACETAMINOPHEN 5-325 MG PO TABS Oral Take 1-2 tablets by mouth every 6 (six) hours as needed.      BP 107/73  Pulse 105  Temp 97.6 F (36.4 C) (Oral)  Resp 18  SpO2 98%  Physical Exam  Nursing note and vitals reviewed. Constitutional: He is oriented to person, place, and time. He appears well-developed and well-nourished.  HENT:  Head: Normocephalic and atraumatic.  Eyes: Conjunctivae and EOM are normal.  Neck: Normal range of motion.  Cardiovascular: Normal rate and regular rhythm.   Pulmonary/Chest: Effort normal and breath sounds normal.  Abdominal: Soft.  Musculoskeletal: Normal range of motion.       Non-tender along the lumbar spinous process Gait limited by pain  No foot drop  No CVA tenderness Lumbar paraspinal pain    Neurological: He is alert and oriented to person, place, and time.  Skin: Skin is warm and dry.  Psychiatric: He has a normal mood and affect.    ED Course  Procedures (including critical care time)  Labs Reviewed - No data to display No results found.  No diagnosis found.    MDM  Pt given muscle relaxants and referrals      I saw and evaluated the patient, reviewed the resident's note and I agree with the findings and plan.  Toy Baker, MD 11/11/11 [phone]

## 2011-11-11 NOTE — ED Notes (Signed)
Pt c/o back pain radiating down right leg x3-4 weeks. States today has been the worst pain yet and his pain medicine is not working. Pt states "I need to find a doctor or something to help deal with this pain."

## 2011-11-11 NOTE — ED Notes (Signed)
Pt c/o lower back pain that radiates to right leg x 3 weeks; pt seen here for same

## 2011-11-11 NOTE — ED Notes (Signed)
Pt d/c home in NAD. PT voiced understanding of d/c instructions.

## 2011-11-12 ENCOUNTER — Ambulatory Visit: Payer: Self-pay | Admitting: Family Medicine

## 2011-11-15 ENCOUNTER — Ambulatory Visit: Payer: Self-pay | Admitting: Family Medicine

## 2011-11-17 ENCOUNTER — Ambulatory Visit (INDEPENDENT_AMBULATORY_CARE_PROVIDER_SITE_OTHER): Payer: Self-pay | Admitting: Sports Medicine

## 2011-11-17 VITALS — BP 128/70 | Ht 75.0 in | Wt 198.0 lb

## 2011-11-17 DIAGNOSIS — IMO0002 Reserved for concepts with insufficient information to code with codable children: Secondary | ICD-10-CM

## 2011-11-17 DIAGNOSIS — M5416 Radiculopathy, lumbar region: Secondary | ICD-10-CM

## 2011-11-17 MED ORDER — GABAPENTIN 300 MG PO CAPS: ORAL_CAPSULE | ORAL | Status: DC

## 2011-11-17 MED ORDER — PREDNISONE (PAK) 10 MG PO TABS: ORAL_TABLET | ORAL | Status: DC

## 2011-11-17 NOTE — Progress Notes (Signed)
  Subjective:    Patient ID: Daniel Downs, male    DOB: 02/13/1976, 36 y.o.   MRN: [id number]  HPI Patient is a 36 y/o male with low back pain radiating to right leg since 10/30/11. Patient says he has always had intermittent low back pain but not really enough to bother him or cause him to seek care. However, he woke up the morning of 10/30/11 with severe pain radiating down his right leg. He says the pain is burning and radiates down his posterior thigh and calf. He also endorses numbness and tingling going into the lateral four toes of the right foot.  He feels that he has to walk on the medial side of his right foot. He endorses weakness of the right leg. He denies bowel or bladder dysfunction. No fever, chills. Patient says he hates sitting because it feels like his coccyx is hurting, however, he also told me he has severe pain with standing. He was recently seen in the emergency room were x-rays of his lumbar spine were obtained. Medical history is positive for schizophrenia but I do not see any antipsychotic medications listed in his chart. He has no known that allergies. Socially, he is a smoker and drinks alcohol on occasion. He does not list an employer or not the patient on his patient questionaire.  Review of Systems As per HPI    Objective:   Physical Exam Gen: alert, uncomfortable appearing male who is sitting off to the side and restless Back and right leg: No bony TTP. No SI joint TTP. Pain with limited forward flexion. Patient guarding with ROM in extension, lateral bending, and rotation. Positive SLR both sitting and laying. Strength 4/5 on foot plantar flexion, 5/5 in all other muscle groups. Is able to walk on toes and heels with significant discomfort. Patellar reflex 2+ bilaterally. Ankle jerk 2+ on left, trace on right.    Assessment & Plan:  #1. Sciatica.  Patient appears to be in severe discomfort at this time, and has had 3 ED visits without successful relief of his pain.  Xrays personally reviewed today and show no bony abnormalities or disc space narrowing. Given severity of patient's symptoms will be fairly aggressive in his treatment. - Prednisone double strength dose pack x 12 days - Gabapentin 300 mg qhs x 1 wk, then increase to 600 mg qhs - Back extension exercises given to patient - Patient instructed to work on applying for [employer] - Followup 3 weeks. If no improvement, may consider additional workup such as MRI at that time. Limited by lack of insurance at this time.

## 2011-11-17 NOTE — Patient Instructions (Addendum)
Thank you for coming in today Take prednisone for pain Take gabapentin at night. Gabapentin 300 mg at night for one week then increase to 600 mg at night. This is for nerve pain Do back extension exercises twice daily Return in 3 weeks for followup Work on getting [employer]

## 2011-12-08 ENCOUNTER — Ambulatory Visit: Payer: Self-pay | Admitting: Sports Medicine

## 2011-12-14 ENCOUNTER — Ambulatory Visit: Payer: Self-pay | Admitting: Sports Medicine

## 2011-12-21 ENCOUNTER — Other Ambulatory Visit: Payer: Self-pay | Admitting: *Deleted

## 2011-12-21 MED ORDER — GABAPENTIN 300 MG PO CAPS: ORAL_CAPSULE | ORAL | Status: DC

## 2011-12-22 ENCOUNTER — Ambulatory Visit: Payer: Self-pay | Admitting: Sports Medicine

## 2012-01-05 ENCOUNTER — Ambulatory Visit: Payer: Self-pay | Admitting: Sports Medicine

## 2012-01-12 ENCOUNTER — Ambulatory Visit: Payer: Self-pay | Admitting: Sports Medicine

## 2012-09-14 ENCOUNTER — Emergency Department (HOSPITAL_COMMUNITY): Payer: Self-pay

## 2012-09-14 ENCOUNTER — Encounter (HOSPITAL_COMMUNITY): Payer: Self-pay | Admitting: Emergency Medicine

## 2012-09-14 ENCOUNTER — Emergency Department (HOSPITAL_COMMUNITY)
Admission: EM | Admit: 2012-09-14 | Discharge: 2012-09-14 | Disposition: A | Discharge status: Home / Self Care | Payer: Self-pay | Attending: Emergency Medicine | Admitting: Emergency Medicine

## 2012-09-14 DIAGNOSIS — M542 Cervicalgia: Secondary | ICD-10-CM | POA: Insufficient documentation

## 2012-09-14 DIAGNOSIS — R0789 Other chest pain: Secondary | ICD-10-CM | POA: Insufficient documentation

## 2012-09-14 DIAGNOSIS — Z8659 Personal history of other mental and behavioral disorders: Secondary | ICD-10-CM | POA: Insufficient documentation

## 2012-09-14 DIAGNOSIS — R05 Cough: Secondary | ICD-10-CM | POA: Insufficient documentation

## 2012-09-14 DIAGNOSIS — R059 Cough, unspecified: Secondary | ICD-10-CM | POA: Insufficient documentation

## 2012-09-14 DIAGNOSIS — M62838 Other muscle spasm: Secondary | ICD-10-CM | POA: Insufficient documentation

## 2012-09-14 DIAGNOSIS — M546 Pain in thoracic spine: Secondary | ICD-10-CM | POA: Insufficient documentation

## 2012-09-14 DIAGNOSIS — J45909 Unspecified asthma, uncomplicated: Secondary | ICD-10-CM | POA: Insufficient documentation

## 2012-09-14 DIAGNOSIS — R112 Nausea with vomiting, unspecified: Secondary | ICD-10-CM | POA: Insufficient documentation

## 2012-09-14 DIAGNOSIS — F17200 Nicotine dependence, unspecified, uncomplicated: Secondary | ICD-10-CM | POA: Insufficient documentation

## 2012-09-14 LAB — COMPREHENSIVE METABOLIC PANEL
ALT: 26 U/L (ref 0–53)
AST: 25 U/L (ref 0–37)
Albumin: 3.4 g/dL — ABNORMAL LOW (ref 3.5–5.2)
Alkaline Phosphatase: 63 U/L (ref 39–117)
CO2: 23 mEq/L (ref 19–32)
Chloride: 105 mEq/L (ref 96–112)
GFR calc non Af Amer: 70 mL/min — ABNORMAL LOW (ref >90)
Potassium: 4.2 mEq/L (ref 3.5–5.1)
Sodium: 140 mEq/L (ref 135–145)
Total Bilirubin: 0.2 mg/dL — ABNORMAL LOW (ref 0.3–1.2)

## 2012-09-14 LAB — TROPONIN I: Troponin I: <0.3 ng/mL (ref <0.30)

## 2012-09-14 LAB — RAPID URINE DRUG SCREEN, HOSP PERFORMED
Barbiturates: NOT DETECTED
Cocaine: NOT DETECTED
Opiates: NOT DETECTED
Tetrahydrocannabinol: NOT DETECTED

## 2012-09-14 LAB — CBC WITH DIFFERENTIAL/PLATELET
Basophils Relative: 1 % (ref 0–1)
Eosinophils Absolute: 0.2 10*3/uL (ref 0.0–0.7)
Eosinophils Relative: 2 % (ref 0–5)
Lymphs Abs: 3.1 10*3/uL (ref 0.7–4.0)
MCH: 28.6 pg (ref 26.0–34.0)
MCHC: 33.8 g/dL (ref 30.0–36.0)
MCV: 84.4 fL (ref 78.0–100.0)
Neutrophils Relative %: 60 % (ref 43–77)
Platelets: 236 10*3/uL (ref 150–400)
RBC: 4.62 MIL/uL (ref 4.22–5.81)

## 2012-09-14 LAB — URINALYSIS, ROUTINE W REFLEX MICROSCOPIC
Nitrite: NEGATIVE
Specific Gravity, Urine: 1.021 (ref 1.005–1.030)
Urobilinogen, UA: 1 mg/dL (ref 0.0–1.0)
pH: 6.5 (ref 5.0–8.0)

## 2012-09-14 LAB — POCT I-STAT TROPONIN I: Troponin i, poc: 0 ng/mL (ref 0.00–0.08)

## 2012-09-14 LAB — D-DIMER, QUANTITATIVE: D-Dimer, Quant: <0.27 ug/mL-FEU (ref 0.00–0.48)

## 2012-09-14 MED ORDER — IBUPROFEN 800 MG PO TABS
800.0000 mg | ORAL_TABLET | Freq: Once | ORAL | Status: AC
  Administered 2012-09-14: 800 mg via ORAL
  Filled 2012-09-14: qty 1

## 2012-09-14 MED ORDER — IBUPROFEN 800 MG PO TABS: 800.0000 mg | ORAL_TABLET | Freq: Three times a day (TID) | ORAL | Status: DC

## 2012-09-14 MED ORDER — CYCLOBENZAPRINE HCL 10 MG PO TABS: 10.0000 mg | ORAL_TABLET | Freq: Two times a day (BID) | ORAL | Status: DC | PRN

## 2012-09-14 MED ORDER — DIAZEPAM 5 MG PO TABS
5.0000 mg | ORAL_TABLET | Freq: Once | ORAL | Status: AC
  Administered 2012-09-14: 5 mg via ORAL
  Filled 2012-09-14: qty 1

## 2012-09-14 MED ORDER — HYDROCODONE-ACETAMINOPHEN 5-325 MG PO TABS
2.0000 | ORAL_TABLET | Freq: Once | ORAL | Status: AC
  Administered 2012-09-14: 2 via ORAL
  Filled 2012-09-14: qty 2

## 2012-09-14 NOTE — ED Notes (Signed)
Patient transported to X-ray 

## 2012-09-14 NOTE — ED Provider Notes (Signed)
History    CSN: [id number] Arrival date & time 09/14/12  4098  First MD Initiated Contact with Patient 09/14/12 [phone]     Chief Complaint  Patient presents with  . Chest Pain   (Consider location/radiation/quality/duration/timing/severity/associated sxs/prior Treatment) HPI Comments: Patient presents with a one-week history of left-sided chest pain that is constant and worse with movement and worse with coughing. Denies any injury or cardiac problems. He admits a history of asthma and schizophrenia. A tobacco abuser and uses marijuana. Denies cocaine abuse. He also complains of pain in his left upper back and neck that radiates to his left shoulder. This is been going on since yesterday. It is worse with movement worse with palpation. There is no associated weakness, numbness or tingling. No fevers. He did have one episode of vomiting 3 days ago. He denies any injury or lifting to his neck. Denies any headache, vision change, lower back pain, abdominal pain  The history is provided by the patient and the EMS personnel.   Past Medical History  Diagnosis Date  . Asthma   . Schizophrenia    History reviewed. No pertinent past surgical history. History reviewed. No pertinent family history. History  Substance Use Topics  . Smoking status: Current Every Day Smoker -- 0.50 packs/day    Types: Cigarettes  . Smokeless tobacco: Not on file  . Alcohol Use: 3.0 oz/week    5 Cans of beer per week    Review of Systems  Constitutional: Negative for fever, activity change and appetite change.  HENT: Positive for neck pain. Negative for congestion, rhinorrhea and neck stiffness.   Respiratory: Positive for cough and chest tightness. Negative for shortness of breath.   Cardiovascular: Positive for chest pain.  Gastrointestinal: Positive for nausea and vomiting. Negative for abdominal pain.  Genitourinary: Negative for dysuria and hematuria.  Musculoskeletal: Negative for back pain.   Neurological: Negative for dizziness, weakness and headaches.  A complete 10 system review of systems was obtained and all systems are negative except as noted in the HPI and PMH.    Allergies  Review of patient's allergies indicates no known allergies.  Home Medications   Current Outpatient Rx  Name  Route  Sig  Dispense  Refill  . cyclobenzaprine (FLEXERIL) 10 MG tablet   Oral   Take 1 tablet (10 mg total) by mouth 2 (two) times daily as needed for muscle spasms.   20 tablet   0   . ibuprofen (ADVIL,MOTRIN) 800 MG tablet   Oral   Take 1 tablet (800 mg total) by mouth 3 (three) times daily.   21 tablet   0    BP 110/63  Pulse 67  Temp(Src) 97.7 F (36.5 C) (Oral)  Resp 18  SpO2 100% Physical Exam  Constitutional: He is oriented to person, place, and time. He appears well-developed and well-nourished. No distress.  HENT:  Head: Normocephalic and atraumatic.  Mouth/Throat: Oropharynx is clear and moist. No oropharyngeal exudate.  Eyes: Conjunctivae and EOM are normal. Pupils are equal, round, and reactive to light.  Neck: Normal range of motion. Neck supple.  Left sided upper trapezius spasm and pain to palpation. No meningismus. No carotid bruits  Cardiovascular: Normal rate, regular rhythm and normal heart sounds.   No murmur heard. Pulmonary/Chest: Effort normal and breath sounds normal. No respiratory distress. He exhibits tenderness.  Abdominal: Soft. There is no tenderness. There is no rebound and no guarding.  Musculoskeletal: Normal range of motion. He exhibits no edema and  no tenderness.  Neurological: He is alert and oriented to person, place, and time. No cranial nerve deficit. He exhibits normal muscle tone. Coordination normal.  Equal grip strengths bilaterally. +2 radial pulse, cardinal hand movements are intact CN 2-12 intact, no ataxia on finger to nose, no nystagmus, 5/5 strength throughout, no pronator drift, Romberg negative, normal gait.   Skin:  Skin is warm.    ED Course  Procedures (including critical care time) Labs Reviewed  COMPREHENSIVE METABOLIC PANEL - Abnormal; Notable for the following:    Albumin 3.4 (*)    Total Bilirubin 0.2 (*)    GFR calc non Af Amer 70 (*)    GFR calc Af Amer 81 (*)    All other components within normal limits  URINALYSIS, ROUTINE W REFLEX MICROSCOPIC - Abnormal; Notable for the following:    Glucose, UA 100 (*)    All other components within normal limits  CBC WITH DIFFERENTIAL  URINE RAPID DRUG SCREEN (HOSP PERFORMED)  TROPONIN I  D-DIMER, QUANTITATIVE  POCT I-STAT TROPONIN I  POCT I-STAT TROPONIN I   Dg Chest 2 View  09/14/2012   *RADIOLOGY REPORT*  Clinical Data: Cough and shortness of breath  CHEST - 2 VIEW  Comparison: None.  Findings: Lungs clear.  Heart size and pulmonary vascularity are normal.  No adenopathy.  No bone lesions.  IMPRESSION: No abnormality noted.   Original Report Authenticated By: Bretta Bang, M.D.   Dg Cervical Spine Complete  09/14/2012   *RADIOLOGY REPORT*  Clinical Data: History of severe left-sided neck pain radiating into the left side of the chest and down the left arm.  No known injury.  Neck stiffness.  CERVICAL SPINE - COMPLETE 4+ VIEW  Comparison: None.  Findings: There is no evidence of prevertebral soft tissue swelling.  There is slight straightening of cervical lordosis. On the AP image there is slight torticollis convexity to the left. These findings may be associated with muscle spasm in some patients.  There is narrowing of the posterior aspect of the intervertebral disc spaces at the levels of C5-C6 and C6-C7.  There is also posterior bony ridging and uncovertebral spurring at these levels with some bilateral foraminal encroachment by spurring.  There is apophyseal joint degenerative spondylosis.  No fracture, bony destruction, or subluxation is seen.  IMPRESSION: There is slight straightening of cervical lordosis. On the AP image there is slight  torticollis convexity to the left. These findings may be associated with muscle spasm in some patients.  Changes of degenerative disc disease and degenerative spondylosis which are detailed above.   Original Report Authenticated By: Onalee Hua Call   1. Cervical paraspinal muscle spasm   2. Atypical chest pain     MDM  One week of left-sided chest pain that is worse with palpation worse with coughing and deep breathing. Associated with dry nonproductive cough. No fever. Also complains of left-sided neck and upper back pain it started this morning. 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 Denies any heavy lifting. Denies headache or vision change.   EKG is nonischemic. Patient's pain is atypical for ACS. Troponin and a d-dimer are negative.  Patient has left paraspinal cervical muscle spasm. There is no weakness in his upper extremities.  There is no evidence of radiculopathy. There is no sensory deficit. Pulses are equal. His neurological exam is normal. Doubt PE or aortic dissection. Doubt ACS.  He given anti-inflammatories and muscle relaxers with good relief.   Date: 09/14/2012  Rate: 75  Rhythm: normal sinus rhythm  QRS Axis: normal  Intervals: normal  ST/T Wave abnormalities: nonspecific ST/T changes  Conduction Disutrbances:none  Narrative Interpretation:   Old EKG Reviewed: unchanged      Date: 09/14/2012  Rate: 62  Rhythm: normal sinus rhythm  QRS Axis: normal  Intervals: normal  ST/T Wave abnormalities: normal  Conduction Disutrbances:none  Narrative Interpretation:   Old EKG Reviewed: unchanged     Glynn Octave, MD 09/14/12 1559

## 2012-09-14 NOTE — ED Notes (Addendum)
PT reports he has neck pain especially when he turns his neck to left and left sided chest pain for 3 days. Denies diaphoresis with pain. Pt reports he vomited a few days ago once.  Worse with inspiration and has a cough. Reports that he has never taken meds for his schizophrenia. Pt actively uses drugs.

## 2013-10-16 ENCOUNTER — Encounter (HOSPITAL_COMMUNITY): Payer: Self-pay | Admitting: Emergency Medicine

## 2013-10-16 ENCOUNTER — Emergency Department (HOSPITAL_COMMUNITY)
Admission: EM | Admit: 2013-10-16 | Discharge: 2013-10-16 | Disposition: A | Discharge status: Home / Self Care | Payer: Self-pay | Attending: Emergency Medicine | Admitting: Emergency Medicine

## 2013-10-16 ENCOUNTER — Emergency Department (HOSPITAL_COMMUNITY): Payer: Self-pay

## 2013-10-16 DIAGNOSIS — M5430 Sciatica, unspecified side: Secondary | ICD-10-CM | POA: Insufficient documentation

## 2013-10-16 DIAGNOSIS — F17200 Nicotine dependence, unspecified, uncomplicated: Secondary | ICD-10-CM | POA: Insufficient documentation

## 2013-10-16 DIAGNOSIS — Z8659 Personal history of other mental and behavioral disorders: Secondary | ICD-10-CM | POA: Insufficient documentation

## 2013-10-16 DIAGNOSIS — R209 Unspecified disturbances of skin sensation: Secondary | ICD-10-CM | POA: Insufficient documentation

## 2013-10-16 DIAGNOSIS — M545 Low back pain: Secondary | ICD-10-CM

## 2013-10-16 DIAGNOSIS — Z8669 Personal history of other diseases of the nervous system and sense organs: Secondary | ICD-10-CM | POA: Insufficient documentation

## 2013-10-16 DIAGNOSIS — M549 Dorsalgia, unspecified: Secondary | ICD-10-CM | POA: Insufficient documentation

## 2013-10-16 DIAGNOSIS — Z79899 Other long term (current) drug therapy: Secondary | ICD-10-CM | POA: Insufficient documentation

## 2013-10-16 DIAGNOSIS — J45909 Unspecified asthma, uncomplicated: Secondary | ICD-10-CM | POA: Insufficient documentation

## 2013-10-16 MED ORDER — DIAZEPAM 5 MG/ML IJ SOLN
5.0000 mg | Freq: Once | INTRAMUSCULAR | Status: AC
  Administered 2013-10-16: 5 mg via INTRAMUSCULAR
  Filled 2013-10-16: qty 2

## 2013-10-16 MED ORDER — PREDNISONE 10 MG PO TABS: 20.0000 mg | ORAL_TABLET | Freq: Every day | ORAL | Status: DC

## 2013-10-16 MED ORDER — HYDROMORPHONE HCL PF 1 MG/ML IJ SOLN
1.0000 mg | Freq: Once | INTRAMUSCULAR | Status: AC
  Administered 2013-10-16: 1 mg via INTRAMUSCULAR
  Filled 2013-10-16: qty 1

## 2013-10-16 MED ORDER — CYCLOBENZAPRINE HCL 10 MG PO TABS: 10.0000 mg | ORAL_TABLET | Freq: Three times a day (TID) | ORAL | Status: DC

## 2013-10-16 MED ORDER — TRAMADOL HCL 50 MG PO TABS: 50.0000 mg | ORAL_TABLET | Freq: Four times a day (QID) | ORAL | Status: DC | PRN

## 2013-10-16 NOTE — ED Notes (Signed)
MD at bedside. 

## 2013-10-16 NOTE — ED Notes (Signed)
Lower back pain x 3 days.  Denies injury.

## 2013-10-16 NOTE — ED Notes (Signed)
Pt c/o lower back pain that radiates down right leg, numbness to right toes, has hx of lower back problems due to basketball injury, denies any new injury. Denies any problems with urination or bowel movements

## 2013-10-16 NOTE — ED Provider Notes (Signed)
CSN: [id number]     Arrival date & time 10/16/13  6578 History  This chart was scribed for Benny Lennert, MD by Leone Payor, ED Scribe. This patient was seen in room APA04/APA04 and the patient's care was started 8:27 AM.      Chief Complaint  Patient presents with  . Back Pain    Patient is a 38 y.o. male presenting with back pain. The history is provided by the patient. No language interpreter was used.  Back Pain Location:  Lumbar spine Radiates to:  R foot Pain severity:  Mild Pain is:  Same all the time Duration:  3 days Timing:  Constant Chronicity:  Recurrent Relieved by:  Nothing Worsened by:  Ambulation, bending and movement Ineffective treatments:  Bed rest and being still Associated symptoms: numbness   Associated symptoms: no abdominal pain, no chest pain, no headaches and no weakness     HPI Comments: Daniel Downs is a 38 y.o. male who presents to the Emergency Department complaining of a new episode of constant lower back pain with radiation to the right leg and foot that began 2-3 days ago. Patient reports history of sciatica. He also has associated numbness and cold sensations to the toes on the right foot.   Past Medical History  Diagnosis Date  . Asthma   . Schizophrenia    History reviewed. No pertinent past surgical history. No family history on file. History  Substance Use Topics  . Smoking status: Current Every Day Smoker -- 0.50 packs/day    Types: Cigarettes  . Smokeless tobacco: Not on file  . Alcohol Use: 3.0 oz/week    5 Cans of beer per week    Review of Systems  Constitutional: Negative for appetite change and fatigue.  HENT: Negative for congestion, ear discharge and sinus pressure.   Eyes: Negative for discharge.  Respiratory: Negative for cough.   Cardiovascular: Negative for chest pain.  Gastrointestinal: Negative for abdominal pain and diarrhea.  Genitourinary: Negative for frequency and hematuria.  Musculoskeletal: Positive for  back pain.  Skin: Negative for rash.  Neurological: Positive for numbness. Negative for seizures, weakness and headaches.  Psychiatric/Behavioral: Negative for hallucinations.      Allergies  Review of patient's allergies indicates no known allergies.  Home Medications   Prior to Admission medications   Medication Sig Start Date End Date Taking? Authorizing Provider  cyclobenzaprine (FLEXERIL) 10 MG tablet Take 1 tablet (10 mg total) by mouth 2 (two) times daily as needed for muscle spasms. 09/14/12   Glynn Octave, MD  ibuprofen (ADVIL,MOTRIN) 800 MG tablet Take 1 tablet (800 mg total) by mouth 3 (three) times daily. 09/14/12   Glynn Octave, MD   BP 123/94  Pulse 94  Temp(Src) 97.9 F (36.6 C) (Oral)  Resp 20  Ht 6\' 2"  (1.88 m)  Wt 219 lb (99.338 kg)  BMI 28.11 kg/m2  SpO2 100% Physical Exam  Nursing note and vitals reviewed. Constitutional: He is oriented to person, place, and time. He appears well-developed.  HENT:  Head: Normocephalic.  Eyes: Conjunctivae are normal.  Neck: No tracheal deviation present.  Cardiovascular:  No murmur heard. Musculoskeletal: Normal range of motion. He exhibits tenderness.  Moderate tenderness to lumbar spine with +SLR right. Good distal pulses   Neurological: He is oriented to person, place, and time.  Skin: Skin is warm.  Psychiatric: He has a normal mood and affect.    ED Course  Procedures (including critical care time)  DIAGNOSTIC STUDIES: Oxygen  Saturation is 100% on RA, normal by my interpretation.    COORDINATION OF CARE: 8:33 AM Discussed treatment plan with pt at bedside and pt agreed to plan.   Labs Review Labs Reviewed - No data to display  Imaging Review No results found.   EKG Interpretation None      MDM   Final diagnoses:  None   The chart was scribed for me under my direct supervision.  I personally performed the history, physical, and medical decision making and all procedures in the evaluation  of this patient.Benny Lennert.   Tomi Paddock L Elijah Phommachanh, MD 10/16/13 [phone]

## 2013-10-16 NOTE — Discharge Instructions (Signed)
Follow up with a family md in 1 week.  Rest at home and no lifting

## 2013-11-30 ENCOUNTER — Encounter (HOSPITAL_COMMUNITY): Payer: Self-pay | Admitting: Emergency Medicine

## 2013-11-30 ENCOUNTER — Emergency Department (HOSPITAL_COMMUNITY)
Admission: EM | Admit: 2013-11-30 | Discharge: 2013-11-30 | Disposition: A | Discharge status: Home / Self Care | Payer: Self-pay | Attending: Emergency Medicine | Admitting: Emergency Medicine

## 2013-11-30 DIAGNOSIS — F17200 Nicotine dependence, unspecified, uncomplicated: Secondary | ICD-10-CM | POA: Insufficient documentation

## 2013-11-30 DIAGNOSIS — J45909 Unspecified asthma, uncomplicated: Secondary | ICD-10-CM | POA: Insufficient documentation

## 2013-11-30 DIAGNOSIS — IMO0002 Reserved for concepts with insufficient information to code with codable children: Secondary | ICD-10-CM | POA: Insufficient documentation

## 2013-11-30 DIAGNOSIS — Z79899 Other long term (current) drug therapy: Secondary | ICD-10-CM | POA: Insufficient documentation

## 2013-11-30 DIAGNOSIS — Z8659 Personal history of other mental and behavioral disorders: Secondary | ICD-10-CM | POA: Insufficient documentation

## 2013-11-30 DIAGNOSIS — R21 Rash and other nonspecific skin eruption: Secondary | ICD-10-CM | POA: Insufficient documentation

## 2013-11-30 MED ORDER — PREDNISONE 50 MG PO TABS
60.0000 mg | ORAL_TABLET | Freq: Once | ORAL | Status: AC
  Administered 2013-11-30: 60 mg via ORAL
  Filled 2013-11-30 (×2): qty 1

## 2013-11-30 MED ORDER — DIPHENHYDRAMINE HCL 25 MG PO CAPS
25.0000 mg | ORAL_CAPSULE | Freq: Once | ORAL | Status: AC
  Administered 2013-11-30: 25 mg via ORAL
  Filled 2013-11-30: qty 1

## 2013-11-30 MED ORDER — DIPHENHYDRAMINE HCL 25 MG PO TABS: 25.0000 mg | ORAL_TABLET | Freq: Four times a day (QID) | ORAL | Status: DC | PRN

## 2013-11-30 MED ORDER — PREDNISONE 20 MG PO TABS: 60.0000 mg | ORAL_TABLET | Freq: Every day | ORAL | Status: DC

## 2013-11-30 NOTE — ED Notes (Signed)
Patient states has had a rash for about 2 months and it has begun spreading.  Patient c/o intense itch.

## 2013-11-30 NOTE — Discharge Instructions (Signed)

## 2013-11-30 NOTE — ED Provider Notes (Signed)
CSN: [id number]     Arrival date & time 11/30/13  4098 History   First MD Initiated Contact with Patient 11/30/13 0354     Chief Complaint  Patient presents with  . Rash     (Consider location/radiation/quality/duration/timing/severity/associated sxs/prior Treatment) Patient is a 38 y.o. male presenting with rash. The history is provided by the patient.  Rash Associated symptoms: no fever    patient has had a pruritic rash for the last 2 months. States he never a rash before  that. Is on both his arms his legs and now his face. It is very itchy. He states it is worse when he takes a shower. No fevers. No trauma. No new soaps. Patient states no history of allergies or asthma, however it looks like his past history does show asthma. No history of eczema. No relief with an "eczema cream"   Past Medical History  Diagnosis Date  . Asthma   . Schizophrenia    History reviewed. No pertinent past surgical history. No family history on file. History  Substance Use Topics  . Smoking status: Current Every Day Smoker -- 0.50 packs/day    Types: Cigarettes  . Smokeless tobacco: Not on file  . Alcohol Use: 3.0 oz/week    5 Cans of beer per week    Review of Systems  Constitutional: Negative for fever and chills.  Skin: Positive for rash. Negative for color change, pallor and wound.      Allergies  Review of patient's allergies indicates no known allergies.  Home Medications   Prior to Admission medications   Medication Sig Start Date End Date Taking? Authorizing Provider  cyclobenzaprine (FLEXERIL) 10 MG tablet Take 1 tablet (10 mg total) by mouth 3 (three) times daily. 10/16/13   Benny Lennert, MD  predniSONE (DELTASONE) 10 MG tablet Take 2 tablets (20 mg total) by mouth daily. 10/16/13   Benny Lennert, MD  traMADol (ULTRAM) 50 MG tablet Take 1 tablet (50 mg total) by mouth every 6 (six) hours as needed. 10/16/13   Benny Lennert, MD   BP 107/65  Pulse 75  Temp(Src) 97.8 F  (36.6 C) (Oral)  Resp 20  Ht  (1.905 m)  Wt 220 lb (99.791 kg)  BMI 27.50 kg/m2  SpO2 98% Physical Exam  Constitutional: He appears well-developed and well-nourished.  Skin: Rash noted.  Slightly raised rash to bilateral antecubital areas somewhat on face and behind his knees. No erythema. No fluctuance    ED Course  Procedures (including critical care time) Labs Review Labs Reviewed - No data to display  Imaging Review No results found.   EKG Interpretation None      MDM   Final diagnoses:  Rash    Patient with rash. Will give Benadryl and steroids. We'll give prescription for both. Will discharge home and will followup as needed. Patient states he was unable to afford steroids in the past    Juliet Rude. Rubin Payor, MD 11/30/13 0430

## 2014-01-01 ENCOUNTER — Encounter (HOSPITAL_COMMUNITY): Payer: Self-pay | Admitting: Emergency Medicine

## 2014-01-01 ENCOUNTER — Emergency Department (HOSPITAL_COMMUNITY)
Admission: EM | Admit: 2014-01-01 | Discharge: 2014-01-01 | Disposition: A | Discharge status: Home / Self Care | Payer: Self-pay | Attending: Emergency Medicine | Admitting: Emergency Medicine

## 2014-01-01 DIAGNOSIS — G8929 Other chronic pain: Secondary | ICD-10-CM | POA: Insufficient documentation

## 2014-01-01 DIAGNOSIS — J45909 Unspecified asthma, uncomplicated: Secondary | ICD-10-CM | POA: Insufficient documentation

## 2014-01-01 DIAGNOSIS — Z720 Tobacco use: Secondary | ICD-10-CM | POA: Insufficient documentation

## 2014-01-01 DIAGNOSIS — Z8659 Personal history of other mental and behavioral disorders: Secondary | ICD-10-CM | POA: Insufficient documentation

## 2014-01-01 DIAGNOSIS — M5441 Lumbago with sciatica, right side: Secondary | ICD-10-CM

## 2014-01-01 DIAGNOSIS — M5431 Sciatica, right side: Secondary | ICD-10-CM | POA: Insufficient documentation

## 2014-01-01 MED ORDER — PREDNISONE 10 MG PO TABS: 20.0000 mg | ORAL_TABLET | Freq: Every day | ORAL | Status: DC

## 2014-01-01 MED ORDER — CYCLOBENZAPRINE HCL 10 MG PO TABS: 10.0000 mg | ORAL_TABLET | Freq: Two times a day (BID) | ORAL | Status: DC | PRN

## 2014-01-01 MED ORDER — TRAMADOL HCL 50 MG PO TABS: 50.0000 mg | ORAL_TABLET | Freq: Four times a day (QID) | ORAL | Status: DC | PRN

## 2014-01-01 MED ORDER — KETOROLAC TROMETHAMINE 60 MG/2ML IM SOLN
60.0000 mg | Freq: Once | INTRAMUSCULAR | Status: AC
  Administered 2014-01-01: 60 mg via INTRAMUSCULAR
  Filled 2014-01-01: qty 2

## 2014-01-01 MED ORDER — OXYCODONE-ACETAMINOPHEN 5-325 MG PO TABS
1.0000 | ORAL_TABLET | Freq: Once | ORAL | Status: AC
  Administered 2014-01-01: 1 via ORAL
  Filled 2014-01-01: qty 1

## 2014-01-01 NOTE — ED Provider Notes (Signed)
CSN: [id number]     Arrival date & time 01/01/14  1956 History  This chart was scribed for non-physician practitioner Felicie Mornavid Harald Quevedo, NP working with Nelia Shiobert L Beaton, MD by Conchita ParisNadim Abuhashem, ED Scribe. This patient was seen in TR08C/TR08C and the patient's care was started at 10:19 PM.     Chief Complaint  Patient presents with  . Back Pain   Patient is a 38 y.o. male presenting with back pain. The history is provided by the patient. No language interpreter was used.  Back Pain Location:  Lumbar spine Quality:  Stabbing and shooting Pain severity:  Severe Pain is:  Same all the time Onset quality:  Gradual Duration:  4 days Timing:  Constant Progression:  Worsening Chronicity:  Recurrent Relieved by:  Nothing Worsened by:  Nothing tried Ineffective treatments:  Ibuprofen  HPI Comments: Dolores Loryndre Zurawski is a 38 y.o. male who presents to the Emergency Department complaining of chronic back pain, he notes his back has worsened the past 4 days. He describes the pain as throbbing, sharp pain that radiates to his hip. Pt denies any recent injury to aggravate his back. He has numbness in 3 right toes as associated symptoms. Pt also says he is unable to sleep due to the pain. He says tylenol provides him minimal relief. He is usually given prednisone and was last given some 3 months ago. Pt has no known drug allergies. Pt plays football and paints. Pt notes he has a slipped disc. Pt has seen a Chiropractor-Dr. Draper once but has not returned.   Past Medical History  Diagnosis Date  . Asthma   . Schizophrenia    History reviewed. No pertinent past surgical history. No family history on file. History  Substance Use Topics  . Smoking status: Current Every Day Smoker -- 2.00 packs/day    Types: Cigarettes  . Smokeless tobacco: Not on file  . Alcohol Use: 3.0 oz/week    5 Cans of beer per week    Review of Systems  Musculoskeletal: Positive for back pain.  All other systems reviewed and are  negative.   Allergies  Review of patient's allergies indicates no known allergies.  Home Medications   Prior to Admission medications   Not on File   BP 99/69  Pulse 102  Temp(Src) 98.4 F (36.9 C) (Oral)  Resp 18  SpO2 97% Physical Exam  Nursing note and vitals reviewed. Constitutional: He is oriented to person, place, and time. He appears well-developed and well-nourished.  HENT:  Head: Normocephalic and atraumatic.  Eyes: EOM are normal.  Neck: Normal range of motion.  Cardiovascular: Normal rate.   Pulmonary/Chest: Effort normal.  Musculoskeletal: Normal range of motion.  Right sided lower back pain that radiates to his buttock and thigh.  Neurological: He is alert and oriented to person, place, and time.  No strength deficits in lower extremities noted.  Skin: Skin is warm and dry.  Psychiatric: He has a normal mood and affect. His behavior is normal.    ED Course  Procedures  DIAGNOSTIC STUDIES: Oxygen Saturation is 97% on room air, adequate by my interpretation.    COORDINATION OF CARE: 10:36 PM Discussed treatment plan with pt at bedside and pt agreed to plan.   Labs Review Labs Reviewed - No data to display  Imaging Review No results found.   EKG Interpretation None     Intermittent chronic low back pain with sciatica that patient endorses flares up several times per year.  No red  flag symptoms.  Has followed-up with Dr. Margaretha Sheffieldraper in the past.  Will trial prednisone, ultram, and flexeril.  Return precautions discussed.  MDM   Final diagnoses:  None  I personally performed the services described in this documentation, which was scribed in my presence. The recorded information has been reviewed and is accurate.     Recurrent low back pain with right sided sciatica.    Jimmye Normanavid John Jackline Castilla, NP 01/02/14 [phone]

## 2014-01-01 NOTE — ED Notes (Signed)
Pt reports lower back pain x "years" but states it has increased in last 4 days. Reports taking tylenol with minimal relief. Pt ambulatory to triage. Denies recent injury. Denies urinary/bowel incontinence.

## 2014-01-01 NOTE — Discharge Instructions (Signed)
Sciatica Sciatica is pain, weakness, numbness, or tingling along the path of the sciatic nerve. The nerve starts in the lower back and runs down the back of each leg. The nerve controls the muscles in the lower leg and in the back of the knee, while also providing sensation to the back of the thigh, lower leg, and the sole of your foot. Sciatica is a symptom of another medical condition. For instance, nerve damage or certain conditions, such as a herniated disk or bone spur on the spine, pinch or put pressure on the sciatic nerve. This causes the pain, weakness, or other sensations normally associated with sciatica. Generally, sciatica only affects one side of the body. CAUSES   Herniated or slipped disc.  Degenerative disk disease.  A pain disorder involving the narrow muscle in the buttocks (piriformis syndrome).  Pelvic injury or fracture.  Pregnancy.  Tumor (rare). SYMPTOMS  Symptoms can vary from mild to very severe. The symptoms usually travel from the low back to the buttocks and down the back of the leg. Symptoms can include:  Mild tingling or dull aches in the lower back, leg, or hip.  Numbness in the back of the calf or sole of the foot.  Burning sensations in the lower back, leg, or hip.  Sharp pains in the lower back, leg, or hip.  Leg weakness.  Severe back pain inhibiting movement. These symptoms may get worse with coughing, sneezing, laughing, or prolonged sitting or standing. Also, being overweight may worsen symptoms. DIAGNOSIS  Your caregiver will perform a physical exam to look for common symptoms of sciatica. He or she may ask you to do certain movements or activities that would trigger sciatic nerve pain. Other tests may be performed to find the cause of the sciatica. These may include:  Blood tests.  X-rays.  Imaging tests, such as an MRI or CT scan. TREATMENT  Treatment is directed at the cause of the sciatic pain. Sometimes, treatment is not necessary  and the pain and discomfort goes away on its own. If treatment is needed, your caregiver may suggest:  Over-the-counter medicines to relieve pain.  Prescription medicines, such as anti-inflammatory medicine, muscle relaxants, or narcotics.  Applying heat or ice to the painful area.  Steroid injections to lessen pain, irritation, and inflammation around the nerve.  Reducing activity during periods of pain.  Exercising and stretching to strengthen your abdomen and improve flexibility of your spine. Your caregiver may suggest losing weight if the extra weight makes the back pain worse.  Physical therapy.  Surgery to eliminate what is pressing or pinching the nerve, such as a bone spur or part of a herniated disk. HOME CARE INSTRUCTIONS   Only take over-the-counter or prescription medicines for pain or discomfort as directed by your caregiver.  Apply ice to the affected area for 20 minutes, 3-4 times a day for the first 48-72 hours. Then try heat in the same way.  Exercise, stretch, or perform your usual activities if these do not aggravate your pain.  Attend physical therapy sessions as directed by your caregiver.  Keep all follow-up appointments as directed by your caregiver.  Do not wear high heels or shoes that do not provide proper support.  Check your mattress to see if it is too soft. A firm mattress may lessen your pain and discomfort. SEEK IMMEDIATE MEDICAL CARE IF:   You lose control of your bowel or bladder (incontinence).  You have increasing weakness in the lower back, pelvis, buttocks,   or legs.  You have redness or swelling of your back.  You have a burning sensation when you urinate.  You have pain that gets worse when you lie down or awakens you at night.  Your pain is worse than you have experienced in the past.  Your pain is lasting longer than 4 weeks.  You are suddenly losing weight without reason. MAKE SURE YOU:  Understand these  instructions.  Will watch your condition.  Will get help right away if you are not doing well or get worse. Document Released: 02/16/2001 Document Revised: 08/24/2011 Document Reviewed: 07/04/2011 Exit[employer] Patient Information 2015 [employer]. This information is not intended to replace advice given to you by your health care provider. Make sure you discuss any questions you have with your health care provider.  

## 2014-01-03 NOTE — ED Provider Notes (Signed)
Medical screening examination/treatment/procedure(s) were performed by non-physician practitioner and as supervising physician I was immediately available for consultation/collaboration.   Rinaldo Macqueen L Teonia Yager, MD 01/03/14 1352 

## 2014-04-05 ENCOUNTER — Encounter (HOSPITAL_COMMUNITY): Payer: Self-pay | Admitting: Emergency Medicine

## 2014-04-05 ENCOUNTER — Emergency Department (HOSPITAL_COMMUNITY)
Admission: EM | Admit: 2014-04-05 | Discharge: 2014-04-05 | Disposition: A | Discharge status: Home / Self Care | Payer: Self-pay | Attending: Emergency Medicine | Admitting: Emergency Medicine

## 2014-04-05 DIAGNOSIS — M5441 Lumbago with sciatica, right side: Secondary | ICD-10-CM | POA: Insufficient documentation

## 2014-04-05 DIAGNOSIS — G8929 Other chronic pain: Secondary | ICD-10-CM | POA: Insufficient documentation

## 2014-04-05 DIAGNOSIS — Z720 Tobacco use: Secondary | ICD-10-CM | POA: Insufficient documentation

## 2014-04-05 DIAGNOSIS — M5416 Radiculopathy, lumbar region: Secondary | ICD-10-CM

## 2014-04-05 DIAGNOSIS — J45909 Unspecified asthma, uncomplicated: Secondary | ICD-10-CM | POA: Insufficient documentation

## 2014-04-05 DIAGNOSIS — Z8659 Personal history of other mental and behavioral disorders: Secondary | ICD-10-CM | POA: Insufficient documentation

## 2014-04-05 HISTORY — DX: Dorsalgia, unspecified: M54.9

## 2014-04-05 MED ORDER — METHOCARBAMOL 500 MG PO TABS: 500.0000 mg | ORAL_TABLET | Freq: Two times a day (BID) | ORAL | Status: DC

## 2014-04-05 MED ORDER — TRAMADOL HCL 50 MG PO TABS: 50.0000 mg | ORAL_TABLET | Freq: Four times a day (QID) | ORAL | Status: DC | PRN

## 2014-04-05 MED ORDER — METHOCARBAMOL 500 MG PO TABS
500.0000 mg | ORAL_TABLET | Freq: Once | ORAL | Status: AC
  Administered 2014-04-05: 500 mg via ORAL
  Filled 2014-04-05: qty 1

## 2014-04-05 MED ORDER — IBUPROFEN 400 MG PO TABS
800.0000 mg | ORAL_TABLET | Freq: Once | ORAL | Status: AC
  Administered 2014-04-05: 800 mg via ORAL
  Filled 2014-04-05: qty 2

## 2014-04-05 MED ORDER — PREDNISONE 20 MG PO TABS: ORAL_TABLET | ORAL | Status: DC

## 2014-04-05 NOTE — ED Notes (Signed)
Wheeled pt to lobby via wheelchair.

## 2014-04-05 NOTE — ED Provider Notes (Signed)
CSN: [id number]     Arrival date & time 04/05/14  1933 History  This chart was scribed for Daniel ForthHannah Marilynne Dupuis, PA-C with Richardean Canalavid H Yao, MD by Tonye RoyaltyJoshua Chen, ED Scribe. This patient was seen in room TR06C/TR06C and the patient's care was started at 8:56 PM.    Chief Complaint  Patient presents with  . Back Pain   HPI  HPI Comments: Daniel Downs is a 39 y.o. male with history of back pain who presents to the Emergency Department complaining of mid-line low back pain with onset this morning. He states he got up and was able to walk this morning with just minor pain and muscle spasm, but states it worsened throughout the day.  He states that by 1300, he could only walk a few steps before he experienced severe pain and was unable to sleep. He reports associated numbness in the toes in his right foot, chronic in nature. He states he has not taken anything for his pain. He states he has similar problems approximately once a month and reports prior diagnosis of sciatica. He was previously referred from here to a specialist near here whom he saw twice but stopped seeing; he does not know his name or speciality. 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  Record review shows that he has been seeing [name], DO  Past Medical History  Diagnosis Date  . Asthma   . Schizophrenia   . Back pain    History reviewed. No pertinent past surgical history. No family history on file. History  Substance Use Topics  . Smoking status: Current Every Day Smoker -- 2.00 packs/day    Types: Cigarettes  . Smokeless tobacco: Not on file  . Alcohol Use: 3.0 oz/week    5 Cans of beer per week    Review of Systems  Constitutional: Negative for fever and fatigue.  Respiratory: Negative for chest tightness and shortness of breath.   Cardiovascular: Negative for chest pain.  Gastrointestinal: Negative for nausea, vomiting, abdominal pain and diarrhea.  Genitourinary: Negative  for dysuria, urgency, frequency and hematuria.  Musculoskeletal: Positive for back pain and gait problem ( 2/2 pain). Negative for joint swelling, neck pain and neck stiffness.  Skin: Negative for rash.  Neurological: Positive for numbness ( chronic). Negative for weakness, light-headedness and headaches.  All other systems reviewed and are negative.     Allergies  Review of patient's allergies indicates no known allergies.  Home Medications   Prior to Admission medications   Medication Sig Start Date End Date Taking? Authorizing Provider  cyclobenzaprine (FLEXERIL) 10 MG tablet Take 1 tablet (10 mg total) by mouth 2 (two) times daily as needed for muscle spasms. 01/01/14   Jimmye Normanavid John Smith, NP  methocarbamol (ROBAXIN) 500 MG tablet Take 1 tablet (500 mg total) by mouth 2 (two) times daily. 04/05/14   [name], PA-C  predniSONE (DELTASONE) 20 MG tablet 3 tabs po daily x 3 days, then 2 tabs x 3 days, then 1.5 tabs x 3 days, then 1 tab x 3 days, then 0.5 tabs x 3 days 04/05/14   [name], PA-C  traMADol (ULTRAM) 50 MG tablet Take 1 tablet (50 mg total) by mouth every 6 (six) hours as needed. 04/05/14   [name], PA-C   BP 113/65 mmHg  Pulse 91  Temp(Src) 98.2 F (36.8 C) (Oral)  Resp 18  Wt 220 lb (99.791 kg)  SpO2 100% Physical Exam  Constitutional: He appears well-developed and well-nourished. No distress.  HENT:  Head: Normocephalic and atraumatic.  Mouth/Throat: Oropharynx is clear and moist. No oropharyngeal exudate.  Eyes: Conjunctivae are normal.  Neck: Normal range of motion. Neck supple.  Full ROM without pain  Cardiovascular: Normal rate, regular rhythm, normal heart sounds and intact distal pulses.   No murmur heard. Pulmonary/Chest: Effort normal and breath sounds normal. No respiratory distress. He has no wheezes.  Abdominal: Soft. He exhibits no distension. There is no tenderness.  Musculoskeletal:  Full range of motion of the T-spine  and L-spine No tenderness to palpation of the spinous processes of the T-spine or L-spine No tenderness to palpation of the paraspinous muscles of the L-spine  Lymphadenopathy:    He has no cervical adenopathy.  Neurological: He is alert. He has normal reflexes.  Reflex Scores:      Patellar reflexes are 2+ on the right side and 2+ on the left side.      Achilles reflexes are 2+ on the right side and 2+ on the left side. Speech is clear and goal oriented, follows commands Normal 5/5 strength in upper and lower extremities bilaterally including dorsiflexion and plantar flexion, strong and equal grip strength Sensation normal to light and sharp touch Moves extremities without ataxia, coordination intact Antalgic gait Normal balance No Clonus   Skin: Skin is warm and dry. No rash noted. He is not diaphoretic. No erythema.  Psychiatric: He has a normal mood and affect. His behavior is normal.  Nursing note and vitals reviewed.   ED Course  Procedures (including critical care time)  DIAGNOSTIC STUDIES: Oxygen Saturation is 99% on room air, normal by my interpretation.    COORDINATION OF CARE: 9:02 PM Discussed treatment plan with patient at beside, the patient agrees with the plan and has no further questions at this time.   Labs Review Labs Reviewed - No data to display  Imaging Review No results found.   EKG Interpretation None      MDM   Final diagnoses:  Lumbar radicular pain  Bilateral low back pain with right-sided sciatica   [name] presents with acute on chronic back pain.  Patient with back pain.  No neurological deficits and normal neuro exam.  Patient can walk but states is painful.  No loss of bowel or bladder control.  No concern for cauda equina.  No fever, night sweats, weight loss, h/o cancer, IVDU.  RICE protocol and pain medicine indicated and discussed with patient.   I have personally reviewed patient's vitals, nursing note and any pertinent  labs or imaging.  I performed an undressed physical exam.    It has been determined that no acute conditions requiring further emergency intervention are present at this time. The patient/guardian have been advised of the diagnosis and plan. I reviewed all labs and imaging including any potential incidental findings. We have discussed signs and symptoms that warrant return to the ED and they are listed in the discharge instructions.    Vital signs are stable at discharge.   BP 113/65 mmHg  Pulse 91  Temp(Src) 98.2 F (36.8 C) (Oral)  Resp 18  Wt 220 lb (99.791 kg)  SpO2 100%         Daniel Forth, PA-C 04/05/14 2149  Richardean Canal, MD 04/05/14 2355

## 2014-04-05 NOTE — Discharge Instructions (Signed)
1. Medications: robaxin, prednisone, tramadol, usual home medications 2. Treatment: rest, drink plenty of fluids, gentle stretching as discussed, alternate ice and heat 3. Follow Up: Please followup with your primary doctor in 3 days for discussion of your diagnoses and further evaluation after today's visit; if you do not have a primary care doctor use the resource guide provided to find one;  Return to the ER for worsening back pain, difficulty walking, loss of bowel or bladder control or other concerning symptoms    Back Exercises Back exercises help treat and prevent back injuries. The goal of back exercises is to increase the strength of your abdominal and back muscles and the flexibility of your back. These exercises should be started when you no longer have back pain. Back exercises include:  Pelvic Tilt. Lie on your back with your knees bent. Tilt your pelvis until the lower part of your back is against the floor. Hold this position 5 to 10 sec and repeat 5 to 10 times.  Knee to Chest. Pull first 1 knee up against your chest and hold for 20 to 30 seconds, repeat this with the other knee, and then both knees. This may be done with the other leg straight or bent, whichever feels better.  Sit-Ups or Curl-Ups. Bend your knees 90 degrees. Start with tilting your pelvis, and do a partial, slow sit-up, lifting your trunk only 30 to 45 degrees off the floor. Take at least 2 to 3 seconds for each sit-up. Do not do sit-ups with your knees out straight. If partial sit-ups are difficult, simply do the above but with only tightening your abdominal muscles and holding it as directed.  Hip-Lift. Lie on your back with your knees flexed 90 degrees. Push down with your feet and shoulders as you raise your hips a couple inches off the floor; hold for 10 seconds, repeat 5 to 10 times.  Back arches. Lie on your stomach, propping yourself up on bent elbows. Slowly press on your hands, causing an arch in your low  back. Repeat 3 to 5 times. Any initial stiffness and discomfort should lessen with repetition over time.  Shoulder-Lifts. Lie face down with arms beside your body. Keep hips and torso pressed to floor as you slowly lift your head and shoulders off the floor. Do not overdo your exercises, especially in the beginning. Exercises may cause you some mild back discomfort which lasts for a few minutes; however, if the pain is more severe, or lasts for more than 15 minutes, do not continue exercises until you see your caregiver. Improvement with exercise therapy for back problems is slow.  See your caregivers for assistance with developing a proper back exercise program. Document Released: 04/01/2004 Document Revised: 05/17/2011 Document Reviewed: 12/24/2010 [facility] Patient Information 2015 [place]. This information is not intended to replace advice given to you by your health care provider. Make sure you discuss any questions you have with your health care provider.

## 2014-04-05 NOTE — ED Notes (Signed)
Called phone number pt gave for his ride - states that she is on her way to get pt.

## 2014-04-05 NOTE — ED Notes (Signed)
C/o flare-up of lower back pain/spams since waking up this morning.  Denies recent injury.  Reports history of back pain.  Denies urinary complaints.

## 2014-04-15 ENCOUNTER — Ambulatory Visit (INDEPENDENT_AMBULATORY_CARE_PROVIDER_SITE_OTHER): Payer: Self-pay | Admitting: Sports Medicine

## 2014-04-15 ENCOUNTER — Encounter: Payer: Self-pay | Admitting: Sports Medicine

## 2014-04-15 VITALS — BP 129/80 | HR 112 | Ht 74.0 in | Wt 213.0 lb

## 2014-04-15 DIAGNOSIS — M5430 Sciatica, unspecified side: Secondary | ICD-10-CM

## 2014-04-15 NOTE — Patient Instructions (Signed)
I need to get an MRI of your low back to see if you have a ruptured disc. I cannot do that until you get your "orange card". Be sure to follow through on this and let me know when you have it so that we can schedule your MRI.

## 2014-04-15 NOTE — Progress Notes (Signed)
   Subjective:    Patient ID: Daniel Downs, male    DOB: May 01, 1975, 39 y.o.   MRN: [id number]  HPI  Patient comes in today with persistent low back pain. I last saw him in the office on 11/17/2011. Presentation at that time is concerning for sciatica. I put him on prednisone and gabapentin and he was awaiting his "orange card". Unfortunately this patient is homeless and has been moving around quite a bit. His back pain persists. He has been seen at multiple emergency rooms across many states. [facility] He was placed on prednisone and given prescriptions for tramadol and for methocarbamol. He has been on oral prednisone several times in the past couple of years. He is currently taking his methocarbamol but states that tramadol does not help his pain. He describes a deep ache in the right side of his low back which at times were radiate into the groin and down the right leg. Weakness as well. He has persistent numbness in his right foot. Recent x-rays of his lumbar spine done in August 2015 showed minimal degenerative changes and nothing acute. Interim medical history is unchanged. Significant for schizophrenia No known drug allergies He is a current smoker and is homeless    Review of Systems     Objective:   Physical Exam Somewhat disheveled appearing. No acute distress  Limited lumbar range of motion secondary to pain. No tenderness to palpation along the lumbar midline  Neurological exam shows the right calf to measure 40 cm, left calf 41 cm. There is weakness both with dorsiflexion and plantar flexion on the right compared to the left. Decreased sensation to light touch across the top of the foot. He has a hyper reflexive patellar reflex on the right 4/4, 2/4 on the left. Bilateral Achilles reflexes are 2/4 and equal bilaterally. No clonus.       Assessment & Plan:  Chronic low back pain-rule out lumbar disc herniation  I need to get an MRI of his lumbar  spine. He is once again in the process of obtaining the "orange card". I've instructed him to contact me once this has happened so that we can schedule his MRI. We need to avoid repeat doses of oral prednisone as he has been on this medication many times in the past couple of years. Further treatment recommendations will be based on his MRI findings.

## 2014-05-13 ENCOUNTER — Ambulatory Visit: Payer: Self-pay | Admitting: Sports Medicine

## 2014-05-31 ENCOUNTER — Encounter (HOSPITAL_COMMUNITY): Payer: Self-pay | Admitting: *Deleted

## 2014-05-31 ENCOUNTER — Emergency Department (HOSPITAL_COMMUNITY)
Admission: EM | Admit: 2014-05-31 | Discharge: 2014-06-03 | Disposition: A | Discharge status: Home / Self Care | Payer: Self-pay | Attending: Emergency Medicine | Admitting: Emergency Medicine

## 2014-05-31 DIAGNOSIS — R197 Diarrhea, unspecified: Secondary | ICD-10-CM | POA: Insufficient documentation

## 2014-05-31 DIAGNOSIS — F22 Delusional disorders: Secondary | ICD-10-CM | POA: Insufficient documentation

## 2014-05-31 DIAGNOSIS — F1910 Other psychoactive substance abuse, uncomplicated: Secondary | ICD-10-CM

## 2014-05-31 DIAGNOSIS — J45909 Unspecified asthma, uncomplicated: Secondary | ICD-10-CM | POA: Insufficient documentation

## 2014-05-31 DIAGNOSIS — F419 Anxiety disorder, unspecified: Secondary | ICD-10-CM | POA: Insufficient documentation

## 2014-05-31 DIAGNOSIS — F10121 Alcohol abuse with intoxication delirium: Secondary | ICD-10-CM | POA: Insufficient documentation

## 2014-05-31 DIAGNOSIS — Z720 Tobacco use: Secondary | ICD-10-CM | POA: Insufficient documentation

## 2014-05-31 DIAGNOSIS — F10921 Alcohol use, unspecified with intoxication delirium: Secondary | ICD-10-CM

## 2014-05-31 DIAGNOSIS — F1410 Cocaine abuse, uncomplicated: Secondary | ICD-10-CM | POA: Insufficient documentation

## 2014-05-31 DIAGNOSIS — F209 Schizophrenia, unspecified: Secondary | ICD-10-CM | POA: Insufficient documentation

## 2014-05-31 DIAGNOSIS — F329 Major depressive disorder, single episode, unspecified: Secondary | ICD-10-CM | POA: Insufficient documentation

## 2014-05-31 NOTE — ED Notes (Signed)
The pt wants to be detoxed from alcohol and  Drugs this am.  He has had a lot of alcohol

## 2014-06-01 ENCOUNTER — Encounter (HOSPITAL_COMMUNITY): Payer: Self-pay | Admitting: *Deleted

## 2014-06-01 DIAGNOSIS — R45851 Suicidal ideations: Secondary | ICD-10-CM

## 2014-06-01 DIAGNOSIS — F209 Schizophrenia, unspecified: Secondary | ICD-10-CM | POA: Diagnosis present

## 2014-06-01 LAB — CBC WITH DIFFERENTIAL/PLATELET
BASOS PCT: 0 % (ref 0–1)
Basophils Absolute: 0 10*3/uL (ref 0.0–0.1)
Eosinophils Absolute: 0.1 10*3/uL (ref 0.0–0.7)
Eosinophils Relative: 1 % (ref 0–5)
HCT: 43.3 % (ref 39.0–52.0)
Hemoglobin: 14.5 g/dL (ref 13.0–17.0)
LYMPHS ABS: 3.8 10*3/uL (ref 0.7–4.0)
LYMPHS PCT: 42 % (ref 12–46)
MCH: 29 pg (ref 26.0–34.0)
MCHC: 33.5 g/dL (ref 30.0–36.0)
MCV: 86.6 fL (ref 78.0–100.0)
MONO ABS: 0.6 10*3/uL (ref 0.1–1.0)
MONOS PCT: 6 % (ref 3–12)
NEUTROS PCT: 51 % (ref 43–77)
Neutro Abs: 4.6 10*3/uL (ref 1.7–7.7)
PLATELETS: 281 10*3/uL (ref 150–400)
RBC: 5 MIL/uL (ref 4.22–5.81)
RDW: 13.7 % (ref 11.5–15.5)
WBC: 9.1 10*3/uL (ref 4.0–10.5)

## 2014-06-01 LAB — COMPREHENSIVE METABOLIC PANEL
ALBUMIN: 4.1 g/dL (ref 3.5–5.2)
ALK PHOS: 68 U/L (ref 39–117)
ALT: 20 U/L (ref 0–53)
AST: 25 U/L (ref 0–37)
Anion gap: 4 — ABNORMAL LOW (ref 5–15)
BILIRUBIN TOTAL: 0.4 mg/dL (ref 0.3–1.2)
BUN: 11 mg/dL (ref 6–23)
CALCIUM: 9.5 mg/dL (ref 8.4–10.5)
CO2: 31 mmol/L (ref 19–32)
CREATININE: 1.46 mg/dL — AB (ref 0.50–1.35)
Chloride: 105 mmol/L (ref 96–112)
GFR calc Af Amer: 70 mL/min — ABNORMAL LOW (ref >90)
GFR, EST NON AFRICAN AMERICAN: 60 mL/min — AB (ref >90)
GLUCOSE: 99 mg/dL (ref 70–99)
POTASSIUM: 4.5 mmol/L (ref 3.5–5.1)
SODIUM: 140 mmol/L (ref 135–145)
TOTAL PROTEIN: 7.5 g/dL (ref 6.0–8.3)

## 2014-06-01 LAB — RAPID URINE DRUG SCREEN, HOSP PERFORMED
AMPHETAMINES: NOT DETECTED
Barbiturates: NOT DETECTED
Benzodiazepines: NOT DETECTED
COCAINE: POSITIVE — AB
OPIATES: NOT DETECTED
Tetrahydrocannabinol: NOT DETECTED

## 2014-06-01 LAB — ETHANOL: Alcohol, Ethyl (B): <5 mg/dL (ref 0–9)

## 2014-06-01 MED ORDER — LORAZEPAM 1 MG PO TABS: 0.0000 mg | ORAL_TABLET | Freq: Two times a day (BID) | ORAL | Status: DC

## 2014-06-01 MED ORDER — IBUPROFEN 400 MG PO TABS
600.0000 mg | ORAL_TABLET | Freq: Three times a day (TID) | ORAL | Status: DC | PRN
  Administered 2014-06-02: 600 mg via ORAL
  Filled 2014-06-01 (×4): qty 1

## 2014-06-01 MED ORDER — LORAZEPAM 1 MG PO TABS: 0.0000 mg | ORAL_TABLET | Freq: Four times a day (QID) | ORAL | Status: DC

## 2014-06-01 MED ORDER — NICOTINE 21 MG/24HR TD PT24
21.0000 mg | MEDICATED_PATCH | Freq: Every day | TRANSDERMAL | Status: DC
  Administered 2014-06-01 – 2014-06-02 (×2): 21 mg via TRANSDERMAL
  Filled 2014-06-01 (×2): qty 1

## 2014-06-01 MED ORDER — VITAMIN B-1 100 MG PO TABS
100.0000 mg | ORAL_TABLET | Freq: Every day | ORAL | Status: DC
  Administered 2014-06-01 – 2014-06-02 (×2): 100 mg via ORAL
  Filled 2014-06-01 (×3): qty 1

## 2014-06-01 MED ORDER — ONDANSETRON HCL 4 MG PO TABS: 4.0000 mg | ORAL_TABLET | Freq: Three times a day (TID) | ORAL | Status: DC | PRN

## 2014-06-01 MED ORDER — THIAMINE HCL 100 MG/ML IJ SOLN: 100.0000 mg | Freq: Every day | INTRAMUSCULAR | Status: DC

## 2014-06-01 MED ORDER — LORAZEPAM 1 MG PO TABS
1.0000 mg | ORAL_TABLET | Freq: Once | ORAL | Status: AC
  Administered 2014-06-01: 1 mg via ORAL
  Filled 2014-06-01: qty 1

## 2014-06-01 MED ORDER — ALUM & MAG HYDROXIDE-SIMETH 200-200-20 MG/5ML PO SUSP: 30.0000 mL | ORAL | Status: DC | PRN

## 2014-06-01 NOTE — ED Notes (Signed)
Room secured for si safety check

## 2014-06-01 NOTE — ED Notes (Signed)
telepsych is complete.

## 2014-06-01 NOTE — ED Notes (Signed)
Notified house coverage and charge rn, patient now has SI ideation.

## 2014-06-01 NOTE — ED Notes (Signed)
Patient placed in paper scrubs. Security currently at the bedside to wand the patient.

## 2014-06-01 NOTE — ED Notes (Signed)
No psych beds available at this time.

## 2014-06-01 NOTE — ED Notes (Signed)
ER staff is currently sitting with the patient.

## 2014-06-01 NOTE — Consult Note (Signed)
Telepsych Consultation   Reason for Consult:  Schizophrenia, delusions Referring Physician:  EDP Patient Identification: Daniel Downs MRN:  [id number] Principal Diagnosis: Schizophrenia, unspecified type Diagnosis:   Patient Active Problem List   Diagnosis Date Noted  . Schizophrenia, unspecified type [F20.9]     Total Time spent with patient: 25 minutes  Subjective:   Daniel Downs is a 39 y.o. male patient admitted with reports of paranoid ideation which is congruent with pt history of schizophrenia. Pt seen and chart reviewed. Pt reports that he has suicidal ideation with command voices in addition to paranoid thoughts. Pt refused to answer many questions reporting "it's top secret man, you won't understand, no one would understand". Pt appears to be responding to internal stimuli and will not maintain eye contract with the camera.   HPI:  Daniel Downs is a 39 y.o. male who voluntarily presents to [facility] for alcohol/cocaine detox. Upon arrival to the emerg dept, pt told staff that he was SI and is hearing voices w/command. Pt is intoxicated and rambling during the interview and is exhibits some paranoia--"can the police hear [place] or see this video". Pt says his family went to [place] and left him at the home, he says he heard somebody in the home and got his gun and was going to shoot them--"supposed I had shot them". Pt reports he hear voices telling to harm himself and sometimes other people, but he has not plan or intent at this time to hurt anyone or himself---"motherfucker you gonna die so you might as well kill yourself". He says he's killing himself everyday with his alcohol/drug habit. Pt says every time he meets a nice girl they break up with him and tell him he needs "mental help" and his family doesn't want anything to do with him until he gets help.   Pt says he "blows $400 a week on alcohol and drugs--"I still got some in my system right now". Pt says he drinks 2-1/5's and  7-8 40's, everyday and his last drink was 05/31/14, he drank 1/2 pint of alcohol and drank 3-4 40's. He also uses 1 gram of cocaine everyday, his last was 05/31/14, he used 1 gram. Pt says he has an upcoming appt with [facility] on 06/03/14.   HPI Elements:   Location:  Psychiatric. Quality:  Worsening. Severity:  Severe. Timing:  Constant. Duration:  Chronic. Context:  Exacerbation of underlying schizophrenia and substance-induced mood disorder with unknown trigger. .  Past Medical History:  Past Medical History  Diagnosis Date  . Asthma   . Schizophrenia   . Back pain    History reviewed. No pertinent past surgical history. Family History: No family history on file. Social History:  History  Alcohol Use  . 3.0 oz/week  . 5 Cans of beer per week     History  Drug Use  . 2.00 per week  . Special: Cocaine, Marijuana    History   Social History  . Marital Status: Single    Spouse Name: N/A  . Number of Children: N/A  . Years of Education: N/A   Social History Main Topics  . Smoking status: Current Every Day Smoker -- 2.00 packs/day    Types: Cigarettes  . Smokeless tobacco: Not on file  . Alcohol Use: 3.0 oz/week    5 Cans of beer per week  . Drug Use: 2.00 per week    Special: Cocaine, Marijuana  . Sexual Activity: Not on file   Other Topics Concern  .  None   Social History Narrative   Additional Social History:    Pain Medications: See MAR  Prescriptions: See MAR  Over the Counter: See MAR  History of alcohol / drug use?: Yes Longest period of sobriety (when/how long): None  Negative Consequences of Use: Work / Youth worker, Charity fundraiser relationships, Scientist, research (physical sciences), Museum/gallery curator Withdrawal Symptoms: Other (Comment) (No current w/d sxs ) Name of Substance 1: Alcohol  1 - Age of First Use: Teens  1 - Amount (size/oz): 2 1/5's and 7-8 40's  1 - Frequency: Daily  1 - Duration: On-going  1 - Last Use / Amount: 05/31/14 Name of Substance 2: Cocaine  2 - Age of First Use: Teens   2 - Amount (size/oz): 1 Gram  2 - Frequency: Daily  2 - Duration: On-going  2 - Last Use / Amount: 05/31/14                 Allergies:  No Known Allergies  Labs:  Results for orders placed or performed during the hospital encounter of 05/31/14 (from the past 48 hour(s))  CBC with Differential/Platelet     Status: None   Collection Time: 06/01/14 12:15 AM  Result Value Ref Range   WBC 9.1 4.0 - 10.5 K/uL   RBC 5.00 4.22 - 5.81 MIL/uL   Hemoglobin 14.5 13.0 - 17.0 g/dL   HCT 43.3 39.0 - 52.0 %   MCV 86.6 78.0 - 100.0 fL   MCH 29.0 26.0 - 34.0 pg   MCHC 33.5 30.0 - 36.0 g/dL   RDW 13.7 11.5 - 15.5 %   Platelets 281 150 - 400 K/uL   Neutrophils Relative % 51 43 - 77 %   Neutro Abs 4.6 1.7 - 7.7 K/uL   Lymphocytes Relative 42 12 - 46 %   Lymphs Abs 3.8 0.7 - 4.0 K/uL   Monocytes Relative 6 3 - 12 %   Monocytes Absolute 0.6 0.1 - 1.0 K/uL   Eosinophils Relative 1 0 - 5 %   Eosinophils Absolute 0.1 0.0 - 0.7 K/uL   Basophils Relative 0 0 - 1 %   Basophils Absolute 0.0 0.0 - 0.1 K/uL  Comprehensive metabolic panel     Status: Abnormal   Collection Time: 06/01/14 12:15 AM  Result Value Ref Range   Sodium 140 135 - 145 mmol/L   Potassium 4.5 3.5 - 5.1 mmol/L   Chloride 105 96 - 112 mmol/L   CO2 31 19 - 32 mmol/L   Glucose, Bld 99 70 - 99 mg/dL   BUN 11 6 - 23 mg/dL   Creatinine, Ser 1.46 (H) 0.50 - 1.35 mg/dL   Calcium 9.5 8.4 - 10.5 mg/dL   Total Protein 7.5 6.0 - 8.3 g/dL   Albumin 4.1 3.5 - 5.2 g/dL   AST 25 0 - 37 U/L   ALT 20 0 - 53 U/L   Alkaline Phosphatase 68 39 - 117 U/L   Total Bilirubin 0.4 0.3 - 1.2 mg/dL   GFR calc non Af Amer 60 (L) >90 mL/min   GFR calc Af Amer 70 (L) >90 mL/min    Comment: (NOTE) The eGFR has been calculated using the CKD EPI equation. This calculation has not been validated in all clinical situations. eGFR's persistently <90 mL/min signify possible Chronic Kidney Disease.    Anion gap 4 (L) 5 - 15  Ethanol     Status: None    Collection Time: 06/01/14 12:15 AM  Result Value Ref Range   Alcohol, Ethyl (  B) <5 0 - 9 mg/dL    Comment:        LOWEST DETECTABLE LIMIT FOR SERUM ALCOHOL IS 11 mg/dL FOR MEDICAL PURPOSES ONLY   Urine rapid drug screen (hosp performed)     Status: Abnormal   Collection Time: 06/01/14 12:45 AM  Result Value Ref Range   Opiates NONE DETECTED NONE DETECTED   Cocaine POSITIVE (A) NONE DETECTED   Benzodiazepines NONE DETECTED NONE DETECTED   Amphetamines NONE DETECTED NONE DETECTED   Tetrahydrocannabinol NONE DETECTED NONE DETECTED   Barbiturates NONE DETECTED NONE DETECTED    Comment:        DRUG SCREEN FOR MEDICAL PURPOSES ONLY.  IF CONFIRMATION IS NEEDED FOR ANY PURPOSE, NOTIFY LAB WITHIN 5 DAYS.        LOWEST DETECTABLE LIMITS FOR URINE DRUG SCREEN Drug Class       Cutoff (ng/mL) Amphetamine      1000 Barbiturate      200 Benzodiazepine   836 Tricyclics       629 Opiates          300 Cocaine          300 THC              50     Vitals: Blood pressure 132/84, pulse 89, temperature 98.7 F (37.1 C), temperature source Oral, resp. rate 18, height '6\' 3"'  (1.905 m), weight 97.523 kg (215 lb), SpO2 100 %.  Risk to Self: Suicidal Ideation: Yes-Currently Present Suicidal Intent: No-Not Currently/Within Last 6 Months Is patient at risk for suicide?: Yes Suicidal Plan?: No-Not Currently/Within Last 6 Months Access to Means: Yes Specify Access to Suicidal Means: Pt has a  gun in the home  What has been your use of drugs/alcohol within the last 12 months?: Absuing: alcohol, cocaine  How many times?:  (Pt says "every day" ) Other Self Harm Risks: None  Triggers for Past Attempts: Unpredictable Intentional Self Injurious Behavior: None Risk to Others: Homicidal Ideation: No Thoughts of Harm to Others: No Current Homicidal Intent: No Current Homicidal Plan: No Access to Homicidal Means: No Identified Victim: None  History of harm to others?: No Assessment of Violence: None  Noted Violent Behavior Description: None  Does patient have access to weapons?: Yes (Comment) (Pt has a gun in the home ) Criminal Charges Pending?: No Does patient have a court date: No Prior Inpatient Therapy: Prior Inpatient Therapy: No Prior Therapy Dates: None  Prior Therapy Facilty/Provider(s): None  Reason for Treatment: None  Prior Outpatient Therapy: Prior Outpatient Therapy: Yes Prior Therapy Dates: 06/03/14 Prior Therapy Facilty/Provider(s): [facility]  Reason for Treatment: Med Mgt/Therapy   Current Facility-Administered Medications  Medication Dose Route Frequency Provider Last Rate Last Dose  . alum & mag hydroxide-simeth (MAALOX/MYLANTA) 200-200-20 MG/5ML suspension 30 mL  30 mL Oral PRN Carlisle Cater, PA-C      . ibuprofen (ADVIL,MOTRIN) tablet 600 mg  600 mg Oral Q8H PRN Carlisle Cater, PA-C      . LORazepam (ATIVAN) tablet 0-4 mg  0-4 mg Oral 4 times per day Carlisle Cater, PA-C   0 mg at 06/01/14 0027   Followed by  . [START ON 06/03/2014] LORazepam (ATIVAN) tablet 0-4 mg  0-4 mg Oral Q12H Carlisle Cater, PA-C      . nicotine (NICODERM CQ - dosed in mg/24 hours) patch 21 mg  21 mg Transdermal Daily Carlisle Cater, PA-C   21 mg at 06/01/14 1008  . ondansetron (ZOFRAN) tablet 4 mg  4  mg Oral Q8H PRN Carlisle Cater, PA-C      . thiamine (VITAMIN B-1) tablet 100 mg  100 mg Oral Daily Carlisle Cater, PA-C   100 mg at 06/01/14 1009   Or  . thiamine (B-1) injection 100 mg  100 mg Intravenous Daily Carlisle Cater, PA-C       Current Outpatient Prescriptions  Medication Sig Dispense Refill  . cyclobenzaprine (FLEXERIL) 10 MG tablet Take 1 tablet (10 mg total) by mouth 2 (two) times daily as needed for muscle spasms. (Patient not taking: Reported on 05/31/2014) 20 tablet 0  . methocarbamol (ROBAXIN) 500 MG tablet Take 1 tablet (500 mg total) by mouth 2 (two) times daily. (Patient not taking: Reported on 05/31/2014) 20 tablet 0  . predniSONE (DELTASONE) 20 MG tablet 3 tabs po daily x 3  days, then 2 tabs x 3 days, then 1.5 tabs x 3 days, then 1 tab x 3 days, then 0.5 tabs x 3 days (Patient not taking: Reported on 05/31/2014) 27 tablet 0  . traMADol (ULTRAM) 50 MG tablet Take 1 tablet (50 mg total) by mouth every 6 (six) hours as needed. (Patient not taking: Reported on 05/31/2014) 15 tablet 0    Musculoskeletal: UTO, Camera  Psychiatric Specialty Exam:     Blood pressure 132/84, pulse 89, temperature 98.7 F (37.1 C), temperature source Oral, resp. rate 18, height '6\' 3"'  (1.905 m), weight 97.523 kg (215 lb), SpO2 100 %.Body mass index is 26.87 kg/(m^2).  General Appearance: Casual  Eye Contact::  Good  Speech:  Pressured  Volume:  Increased  Mood:  Angry and Anxious  Affect:  Blunt, Non-Congruent, Inappropriate and Labile  Thought Process:  Disorganized and Irrelevant  Orientation:  Full (Time, Place, and Person)  Thought Content:  Delusions and Hallucinations: Auditory Visual  Suicidal Thoughts:  Yes.  with intent/plan  Homicidal Thoughts:  No  Memory:  Immediate;   Fair Recent;   Fair Remote;   Fair  Judgement:  Fair  Insight:  Fair  Psychomotor Activity:  Increased  Concentration:  Poor  Recall:  Good  Fund of Knowledge:Good  Language: Good  Akathisia:  No  Handed:    AIMS (if indicated):     Assets:  Resilience  ADL's:  Intact  Cognition: WNL  Sleep:      Medical Decision Making: Review of Psycho-Social Stressors (1), Review or order clinical lab tests (1) and Established Problem, Worsening (2)   Treatment Plan Summary: Daily contact with patient to assess and evaluate symptoms and progress in treatment  Plan:  Recommend psychiatric Inpatient admission when medically cleared.  Disposition:  -Seek inpatient placement for safety and psychiatric stabilization  Benjamine Mola, FNP-BC 06/01/2014 12:21PM      Case discussed with me as above

## 2014-06-01 NOTE — ED Notes (Signed)
Security states patient is clear after wanding.

## 2014-06-01 NOTE — ED Notes (Signed)
Breakfast tray ordered, regular diet, no sharps.

## 2014-06-01 NOTE — ED Provider Notes (Signed)
CSN: [id number]     Arrival date & time 05/31/14  2028 History   First MD Initiated Contact with Patient 05/31/14 2315     Chief Complaint  Patient presents with  . detox      (Consider location/radiation/quality/duration/timing/severity/associated sxs/prior Treatment) HPI Comments: Patient with history of schizophenia, polysubstance abuse presents with request for detox from alcohol, also with complaint of depression with transient thoughts of hurting himself and paranoia. States that he was drinking liquor heavily today and also used cocaine. States that he feels like people are talking about him and judging him. He states he hears voices in the tv. He denies being on medications for his schizophrenia. Denies other drugs. No medical complaints otherwise except for diarrhea. The onset of this condition was acute. The course is constant. Aggravating factors: none. Alleviating factors: none.    The history is provided by the patient.    Past Medical History  Diagnosis Date  . Asthma   . Schizophrenia   . Back pain    History reviewed. No pertinent past surgical history. No family history on file. History  Substance Use Topics  . Smoking status: Current Every Day Smoker -- 2.00 packs/day    Types: Cigarettes  . Smokeless tobacco: Not on file  . Alcohol Use: 3.0 oz/week    5 Cans of beer per week    Review of Systems  Constitutional: Negative for fever.  HENT: Negative for rhinorrhea and sore throat.   Eyes: Negative for redness.  Respiratory: Negative for cough.   Cardiovascular: Negative for chest pain.  Gastrointestinal: Positive for diarrhea. Negative for nausea, vomiting and abdominal pain.  Genitourinary: Negative for dysuria.  Musculoskeletal: Negative for myalgias.  Skin: Negative for rash.  Neurological: Negative for headaches.  Psychiatric/Behavioral: Positive for suicidal ideas, hallucinations and dysphoric mood.      Allergies  Review of patient's allergies  indicates no known allergies.  Home Medications   Prior to Admission medications   Medication Sig Start Date End Date Taking? Authorizing Provider  cyclobenzaprine (FLEXERIL) 10 MG tablet Take 1 tablet (10 mg total) by mouth 2 (two) times daily as needed for muscle spasms. Patient not taking: Reported on 05/31/2014 01/01/14   Felicie Morn, NP  methocarbamol (ROBAXIN) 500 MG tablet Take 1 tablet (500 mg total) by mouth 2 (two) times daily. Patient not taking: Reported on 05/31/2014 04/05/14   Dahlia Client Muthersbaugh, PA-C  predniSONE (DELTASONE) 20 MG tablet 3 tabs po daily x 3 days, then 2 tabs x 3 days, then 1.5 tabs x 3 days, then 1 tab x 3 days, then 0.5 tabs x 3 days Patient not taking: Reported on 05/31/2014 04/05/14   Dahlia Client Muthersbaugh, PA-C  traMADol (ULTRAM) 50 MG tablet Take 1 tablet (50 mg total) by mouth every 6 (six) hours as needed. Patient not taking: Reported on 05/31/2014 04/05/14   Dahlia Client Muthersbaugh, PA-C   BP 107/76 mmHg  Pulse 104  Temp(Src) 98.5 F (36.9 C)  Resp 16  Ht  (1.905 m)  Wt 215 lb (97.523 kg)  BMI 26.87 kg/m2  SpO2 98%   Physical Exam  Constitutional: He appears well-developed and well-nourished.  HENT:  Head: Normocephalic and atraumatic.  Eyes: Conjunctivae are normal. Right eye exhibits no discharge. Left eye exhibits no discharge.  Neck: Normal range of motion. Neck supple.  Cardiovascular: Normal rate, regular rhythm and normal heart sounds.   Pulmonary/Chest: Effort normal and breath sounds normal.  Abdominal: Soft. There is no tenderness.  Neurological: He is  alert.  Skin: Skin is warm and dry.  Psychiatric: His speech is normal. His mood appears anxious. His affect is angry and labile. He is agitated. Thought content is paranoid. He exhibits a depressed mood. He expresses suicidal ideation. He expresses no homicidal ideation. He expresses no suicidal plans.  Nursing note and vitals reviewed.   ED Course  Procedures (including critical care  time) Labs Review Labs Reviewed  CBC WITH DIFFERENTIAL/PLATELET  COMPREHENSIVE METABOLIC PANEL  URINE RAPID DRUG SCREEN (HOSP PERFORMED)  ETHANOL    Imaging Review No results found.   EKG Interpretation None      12:07 AM Patient seen and examined. Work-up initiated. Medications ordered. Will need TTS consult.   Vital signs reviewed and are as follows: BP 107/76 mmHg  Pulse 104  Temp(Src) 98.5 F (36.9 C)  Resp 16  Ht 6\' 3"  (1.905 m)  Wt 215 lb (97.523 kg)  BMI 26.87 kg/m2  SpO2 98%  12:58 AM Completion of medical clearance evaluation pending. Patient is currently voluntary. Sitter at bedside. Pending TTS eval. Handoff to Manus RuddSchulz NP at shift change.     MDM   Final diagnoses:  Alcohol intoxication, with delirium  Polysubstance abuse  Schizophrenia, unspecified type   Pending medical clearance, TTS eval. Pt with alcohol intoxication complicated by paranoia/hallucinations with underlying history of schizophrenia per patient history and records. Feel patient needs psych eval.    Renne CriglerJoshua Jonah Nestle, PA-C 06/01/14 0100  Dione Boozeavid Glick, MD 06/01/14 [phone]

## 2014-06-01 NOTE — Progress Notes (Signed)
CSW pursued inpatient placement for patient.  CSW also followed up on previous referrals made.  [facility] [place]  No Answer: [facility]  Faxed To:  Christell ConstantMoore: Lucrezia Europeiane Frye: Tammy    Left Message: [place] [facility] (No Answer)  [facility]  [place] [facility]  [facility]  [facility] [place]   Elena Davia, [place] Disposition Social Worker [phone]

## 2014-06-01 NOTE — BH Assessment (Signed)
Tele Assessment Note   Daniel Downs is a 39 y.o. male who voluntarily presents to [facility] for alcohol/cocaine detox.  Upon arrival to the emerg dept, pt told staff that he was SI and is hearing voices w/command.  Pt is intoxicated and rambling during the interview and is exhibits some paranoia--"can the police hear [place] or see this video".  Pt says his family went to [place] and left him at the home, he says he heard somebody in the home and got his gun and was going to shoot them--"supposed I had shot them".  Pt reports he hear voices telling to harm himself and sometimes other people, but he has not plan or intent at this time to hurt anyone or himself---"motherfucker you gonna die so you might as well kill yourself". He says he's killing himself everyday with his alcohol/drug habit.   Pt says every time he meets a nice girl they break up with him and tell him he needs "mental help" and his family doesn't want anything to do with him until he gets help.    Pt says he "blows $400 a week on alcohol and drugs--"I still got some in my system right now".  Pt says he drinks 2-1/5's and 7-8 40's, everyday and his last drink was 05/31/14, he drank 1/2 pint of alcohol and drank 3-4 40's.  He also uses 1 gram of cocaine everyday, his last was 05/31/14, he used 1 gram.  Pt says he has an upcoming appt with [facility] on 06/03/14.   Axis I: Schizophrenia; Alcohol use disorder, Severe; Cocaine use disorder, Severe Axis II: Deferred Axis III:  Past Medical History  Diagnosis Date  . Asthma   . Schizophrenia   . Back pain    Axis IV: other psychosocial or environmental problems, problems related to social environment and problems with primary support group Axis V: 31-40 impairment in reality testing  Past Medical History:  Past Medical History  Diagnosis Date  . Asthma   . Schizophrenia   . Back pain     History reviewed. No pertinent past surgical history.  Family History: No family history on  file.  Social History:  reports that he has been smoking Cigarettes.  He has been smoking about 2.00 packs per day. He does not have any smokeless tobacco history on file. He reports that he drinks about 3.0 oz of alcohol per week. He reports that he uses illicit drugs (Cocaine and Marijuana) about twice per week.  Additional Social History:  Alcohol / Drug Use Pain Medications: See MAR  Prescriptions: See MAR  Over the Counter: See MAR  History of alcohol / drug use?: Yes Longest period of sobriety (when/how long): None  Negative Consequences of Use: Work / Programmer, multimedia, Copywriter, advertising relationships, Armed forces operational officer, Surveyor, quantity Withdrawal Symptoms: Other (Comment) (No current w/d sxs ) Substance #1 Name of Substance 1: Alcohol  1 - Age of First Use: Teens  1 - Amount (size/oz): 2 1/5's and 7-8 40's  1 - Frequency: Daily  1 - Duration: On-going  1 - Last Use / Amount: 05/31/14 Substance #2 Name of Substance 2: Cocaine  2 - Age of First Use: Teens  2 - Amount (size/oz): 1 Gram  2 - Frequency: Daily  2 - Duration: On-going  2 - Last Use / Amount: 05/31/14  CIWA: CIWA-Ar BP: 116/81 mmHg Pulse Rate: 73 Nausea and Vomiting: mild nausea with no vomiting Tactile Disturbances: none Tremor: no tremor Auditory Disturbances: not present Paroxysmal Sweats: no sweat visible  Visual Disturbances: not present Anxiety: three Headache, Fullness in Head: very mild Agitation: normal activity Orientation and Clouding of Sensorium: oriented and can do serial additions CIWA-Ar Total: 5 COWS:    PATIENT STRENGTHS: (choose at least two) Supportive family/friends  Allergies: No Known Allergies  Home Medications:  (Not in a hospital admission)  OB/GYN Status:  No LMP for male patient.  General Assessment Data [facility] Is this a Tele or Face-to-Face Assessment?: Tele Assessment Is this an Initial Assessment or a Re-assessment for this encounter?: Initial Assessment Living Arrangements:  Other (Comment) (Lives with family) Can pt return to current living arrangement?: Yes Admission Status: Voluntary Is patient capable of signing voluntary admission?: Yes Transfer from: Home Referral Source: Self/Family/Friend  Medical Screening Exam [facility] Walk-in ONLY) Medical Exam completed: No Reason for MSE not completed: Other: (None )  [facility] Crisis Care Plan Living Arrangements: Other (Comment) (Lives with family) Name of Psychiatrist: [facility]--appt on 06/03/14 Name of Therapist: None   Education Status Is patient currently in school?: No Current Grade: Noe  Highest grade of school patient has completed: None  Name of school: None  Contact person: None   Risk to self with the past 6 months Suicidal Ideation: Yes-Currently Present Suicidal Intent: No-Not Currently/Within Last 6 Months Is patient at risk for suicide?: Yes Suicidal Plan?: No-Not Currently/Within Last 6 Months Access to Means: Yes Specify Access to Suicidal Means: Pt has a  gun in the home  What has been your use of drugs/alcohol within the last 12 months?: Absuing: alcohol, cocaine  Previous Attempts/Gestures: Yes How many times?:  (Pt says "every day" ) Other Self Harm Risks: None  Triggers for Past Attempts: Unpredictable Intentional Self Injurious Behavior: None Family Suicide History: No Recent stressful life event(s): Other (Comment) (Chronic MH SA ) Persecutory voices/beliefs?: Yes Depression: Yes Depression Symptoms: Loss of interest in usual pleasures Substance abuse history and/or treatment for substance abuse?: Yes Suicide prevention information given to non-admitted patients: Not applicable  Risk to Others within the past 6 months Homicidal Ideation: No Thoughts of Harm to Others: No Current Homicidal Intent: No Current Homicidal Plan: No Access to Homicidal Means: No Identified Victim: None  History of harm to others?: No Assessment of Violence: None Noted Violent Behavior Description:  None  Does patient have access to weapons?: Yes (Comment) (Pt has a gun in the home ) Criminal Charges Pending?: No Does patient have a court date: No  Psychosis Hallucinations: With command, Auditory Delusions: None noted  Mental Status Report Appearance/Hygiene: Disheveled, In scrubs Eye Contact: Good Motor Activity: Unremarkable Speech: Logical/coherent, Slurred Level of Consciousness: Alert Mood: Depressed Affect: Appropriate to circumstance, Depressed Anxiety Level: None Thought Processes: Coherent, Relevant Judgement: Impaired Orientation: Person, Place, Situation Obsessive Compulsive Thoughts/Behaviors: None  Cognitive Functioning Concentration: Decreased Memory: Recent Intact, Remote Intact IQ: Average Insight: Poor Impulse Control: Poor Appetite: Good Weight Loss: 0 Weight Gain: 0 Sleep: No Change Total Hours of Sleep: 6 Vegetative Symptoms: None  ADLScreening [facility] Assessment Services) Patient's cognitive ability adequate to safely complete daily activities?: Yes Patient able to express need for assistance with ADLs?: Yes Independently performs ADLs?: Yes (appropriate for developmental age)  Prior Inpatient Therapy Prior Inpatient Therapy: No Prior Therapy Dates: None  Prior Therapy Facilty/Provider(s): None  Reason for Treatment: None   Prior Outpatient Therapy Prior Outpatient Therapy: Yes Prior Therapy Dates: 06/03/14 Prior Therapy Facilty/Provider(s): [facility]  Reason for Treatment: Med Mgt/Therapy   ADL Screening (condition at time of admission) Patient's cognitive  ability adequate to safely complete daily activities?: Yes Is the patient deaf or have difficulty hearing?: No Does the patient have difficulty seeing, even when wearing glasses/contacts?: No Does the patient have difficulty concentrating, remembering, or making decisions?: Yes Patient able to express need for assistance with ADLs?: Yes Does the patient have difficulty dressing or  bathing?: No Independently performs ADLs?: Yes (appropriate for developmental age) Does the patient have difficulty walking or climbing stairs?: No Weakness of Legs: None Weakness of Arms/Hands: None  Home Assistive Devices/Equipment Home Assistive Devices/Equipment: None  Therapy Consults (therapy consults require a physician order) PT Evaluation Needed: No OT Evalulation Needed: No SLP Evaluation Needed: No Abuse/Neglect Assessment (Assessment to be complete while patient is alone) Physical Abuse: Denies Verbal Abuse: Denies Sexual Abuse: Denies Exploitation of patient/patient's resources: Denies Self-Neglect: Denies Values / Beliefs Cultural Requests During Hospitalization: None Spiritual Requests During Hospitalization: None Consults Spiritual Care Consult Needed: No Social Work Consult Needed: No Merchant navy officer (For Healthcare) Does patient have an advance directive?: No Would patient like information on creating an advanced directive?: No - patient declined information    Additional Information 1:1 In Past 12 Months?: No CIRT Risk: No Elopement Risk: No Does patient have medical clearance?: Yes     Disposition:  Disposition Initial Assessment Completed for this Encounter: Yes Disposition of Patient: Referred to (Per Hulan Fess, NP ) Patient referred to: Other (Comment) (Per Hulan Fess, NP )  Beatrix Shipper C 06/01/2014 1:34 AM

## 2014-06-02 NOTE — ED Notes (Signed)
Leo, SW, in w/pt. 

## 2014-06-02 NOTE — ED Notes (Signed)
PT NOTED TO BE TALKATIVE W/SITTER AND LAUGHING.

## 2014-06-02 NOTE — [facility] Counselor (Signed)
Faxed out to the following facilities with open beds: [facility]  Ulonda Klosowski, M.S., LPCA, [place], [facility] Licensed Professional Counselor Associate  Triage Specialist  [facility]  Therapeutic Triage Services Phone: [phone] Fax: [phone]

## 2014-06-02 NOTE — ED Notes (Addendum)
Pt confirmed appt with [facility], it is at 8am on 06/03/14.

## 2014-06-02 NOTE — ED Notes (Signed)
CIWA D/C'D D/T PT SCORES 0.

## 2014-06-02 NOTE — Consult Note (Signed)
[facility] Face-to-face Psychiatry Consultation   Reason for Consult:  Schizophrenia, delusions Referring Physician:  EDP Patient Identification: Daniel Downs MRN:  [id number] Principal Diagnosis: Schizophrenia, unspecified type Diagnosis:   Patient Active Problem List   Diagnosis Date Noted  . Schizophrenia, unspecified type [F20.9]     Priority: High    Total Time spent with patient: 25 minutes  Subjective:   Daniel Downs is a 39 y.o. male patient admitted with reports of paranoid ideation which is congruent with pt history of schizophrenia. Pt seen and chart reviewed. Pt continues to present with mild psychosis, yet is improving. Pt has improved to the point that he could function in a rehab facility for substance abuse. Pt was offered inpatient psychiatric treatment, which he declined with a preference fore inpatient rehab, which he reports is already arranged for him tomorrow on 03/26. This NP confirmed that this is the case. Pt denies SI, HI, and AVH and reports that he knows he will use drugs if he leaves this evening because he just received his paycheck.    Initial evaluation 03/27: Pt reports that he has suicidal ideation with command voices in addition to paranoid thoughts. Pt refused to answer many questions reporting "it's top secret man, you won't understand, no one would understand". Pt appears to be responding to internal stimuli and will not maintain eye contract with the camera.   HPI:  Daniel Downs is a 39 y.o. male who voluntarily presents to [facility] for alcohol/cocaine detox. Upon arrival to the emerg dept, pt told staff that he was SI and is hearing voices w/command. Pt is intoxicated and rambling during the interview and is exhibits some paranoia--"can the police hear [place] or see this video". Pt says his family went to [place] and left him at the home, he says he heard somebody in the home and got his gun and was going to shoot them--"supposed I had shot them". Pt reports he  hear voices telling to harm himself and sometimes other people, but he has not plan or intent at this time to hurt anyone or himself---"motherfucker you gonna die so you might as well kill yourself". He says he's killing himself everyday with his alcohol/drug habit. Pt says every time he meets a nice girl they break up with him and tell him he needs "mental help" and his family doesn't want anything to do with him until he gets help.   Pt says he "blows $400 a week on alcohol and drugs--"I still got some in my system right now". Pt says he drinks 2-1/5's and 7-8 40's, everyday and his last drink was 05/31/14, he drank 1/2 pint of alcohol and drank 3-4 40's. He also uses 1 gram of cocaine everyday, his last was 05/31/14, he used 1 gram. Pt says he has an upcoming appt with [facility] on 06/03/14.   HPI Elements:   Location:  Psychiatric. Quality:  Worsening. Severity:  Severe. Timing:  Constant. Duration:  Chronic. Context:  Exacerbation of underlying schizophrenia and substance-induced mood disorder with unknown trigger. .  Past Medical History:  Past Medical History  Diagnosis Date  . Asthma   . Schizophrenia   . Back pain    History reviewed. No pertinent past surgical history. Family History: No family history on file. Social History:  History  Alcohol Use  . 3.0 oz/week  . 5 Cans of beer per week     History  Drug Use  . 2.00 per week  . Special: Cocaine, Marijuana  History   Social History  . Marital Status: Single    Spouse Name: N/A  . Number of Children: N/A  . Years of Education: N/A   Social History Main Topics  . Smoking status: Current Every Day Smoker -- 2.00 packs/day    Types: Cigarettes  . Smokeless tobacco: Not on file  . Alcohol Use: 3.0 oz/week    5 Cans of beer per week  . Drug Use: 2.00 per week    Special: Cocaine, Marijuana  . Sexual Activity: Not on file   Other Topics Concern  . None   Social History Narrative   Additional Social  History:    Pain Medications: See MAR  Prescriptions: See MAR  Over the Counter: See MAR  History of alcohol / drug use?: Yes Longest period of sobriety (when/how long): None  Negative Consequences of Use: Work / Youth worker, Charity fundraiser relationships, Scientist, research (physical sciences), Museum/gallery curator Withdrawal Symptoms: Other (Comment) (No current w/d sxs ) Name of Substance 1: Alcohol  1 - Age of First Use: Teens  1 - Amount (size/oz): 2 1/5's and 7-8 40's  1 - Frequency: Daily  1 - Duration: On-going  1 - Last Use / Amount: 05/31/14 Name of Substance 2: Cocaine  2 - Age of First Use: Teens  2 - Amount (size/oz): 1 Gram  2 - Frequency: Daily  2 - Duration: On-going  2 - Last Use / Amount: 05/31/14                 Allergies:  No Known Allergies  Labs:  Results for orders placed or performed during the hospital encounter of 05/31/14 (from the past 48 hour(s))  CBC with Differential/Platelet     Status: None   Collection Time: 06/01/14 12:15 AM  Result Value Ref Range   WBC 9.1 4.0 - 10.5 K/uL   RBC 5.00 4.22 - 5.81 MIL/uL   Hemoglobin 14.5 13.0 - 17.0 g/dL   HCT 43.3 39.0 - 52.0 %   MCV 86.6 78.0 - 100.0 fL   MCH 29.0 26.0 - 34.0 pg   MCHC 33.5 30.0 - 36.0 g/dL   RDW 13.7 11.5 - 15.5 %   Platelets 281 150 - 400 K/uL   Neutrophils Relative % 51 43 - 77 %   Neutro Abs 4.6 1.7 - 7.7 K/uL   Lymphocytes Relative 42 12 - 46 %   Lymphs Abs 3.8 0.7 - 4.0 K/uL   Monocytes Relative 6 3 - 12 %   Monocytes Absolute 0.6 0.1 - 1.0 K/uL   Eosinophils Relative 1 0 - 5 %   Eosinophils Absolute 0.1 0.0 - 0.7 K/uL   Basophils Relative 0 0 - 1 %   Basophils Absolute 0.0 0.0 - 0.1 K/uL  Comprehensive metabolic panel     Status: Abnormal   Collection Time: 06/01/14 12:15 AM  Result Value Ref Range   Sodium 140 135 - 145 mmol/L   Potassium 4.5 3.5 - 5.1 mmol/L   Chloride 105 96 - 112 mmol/L   CO2 31 19 - 32 mmol/L   Glucose, Bld 99 70 - 99 mg/dL   BUN 11 6 - 23 mg/dL   Creatinine, Ser 1.46 (H) 0.50 - 1.35 mg/dL    Calcium 9.5 8.4 - 10.5 mg/dL   Total Protein 7.5 6.0 - 8.3 g/dL   Albumin 4.1 3.5 - 5.2 g/dL   AST 25 0 - 37 U/L   ALT 20 0 - 53 U/L   Alkaline Phosphatase 68 39 - 117  U/L   Total Bilirubin 0.4 0.3 - 1.2 mg/dL   GFR calc non Af Amer 60 (L) >90 mL/min   GFR calc Af Amer 70 (L) >90 mL/min    Comment: (NOTE) The eGFR has been calculated using the CKD EPI equation. This calculation has not been validated in all clinical situations. eGFR's persistently <90 mL/min signify possible Chronic Kidney Disease.    Anion gap 4 (L) 5 - 15  Ethanol     Status: None   Collection Time: 06/01/14 12:15 AM  Result Value Ref Range   Alcohol, Ethyl (B) <5 0 - 9 mg/dL    Comment:        LOWEST DETECTABLE LIMIT FOR SERUM ALCOHOL IS 11 mg/dL FOR MEDICAL PURPOSES ONLY   Urine rapid drug screen (hosp performed)     Status: Abnormal   Collection Time: 06/01/14 12:45 AM  Result Value Ref Range   Opiates NONE DETECTED NONE DETECTED   Cocaine POSITIVE (A) NONE DETECTED   Benzodiazepines NONE DETECTED NONE DETECTED   Amphetamines NONE DETECTED NONE DETECTED   Tetrahydrocannabinol NONE DETECTED NONE DETECTED   Barbiturates NONE DETECTED NONE DETECTED    Comment:        DRUG SCREEN FOR MEDICAL PURPOSES ONLY.  IF CONFIRMATION IS NEEDED FOR ANY PURPOSE, NOTIFY LAB WITHIN 5 DAYS.        LOWEST DETECTABLE LIMITS FOR URINE DRUG SCREEN Drug Class       Cutoff (ng/mL) Amphetamine      1000 Barbiturate      200 Benzodiazepine   741 Tricyclics       423 Opiates          300 Cocaine          300 THC              50     Vitals: Blood pressure 95/57, pulse 69, temperature 98.3 F (36.8 C), temperature source Oral, resp. rate 16, height '6\' 3"'  (1.905 m), weight 97.523 kg (215 lb), SpO2 98 %.  Risk to Self: Suicidal Ideation: Yes-Currently Present Suicidal Intent: No-Not Currently/Within Last 6 Months Is patient at risk for suicide?: Yes Suicidal Plan?: No-Not Currently/Within Last 6 Months Access to  Means: Yes Specify Access to Suicidal Means: Pt has a  gun in the home  What has been your use of drugs/alcohol within the last 12 months?: Absuing: alcohol, cocaine  How many times?:  (Pt says "every day" ) Other Self Harm Risks: None  Triggers for Past Attempts: Unpredictable Intentional Self Injurious Behavior: None Risk to Others: Homicidal Ideation: No Thoughts of Harm to Others: No Current Homicidal Intent: No Current Homicidal Plan: No Access to Homicidal Means: No Identified Victim: None  History of harm to others?: No Assessment of Violence: None Noted Violent Behavior Description: None  Does patient have access to weapons?: Yes (Comment) (Pt has a gun in the home ) Criminal Charges Pending?: No Does patient have a court date: No Prior Inpatient Therapy: Prior Inpatient Therapy: No Prior Therapy Dates: None  Prior Therapy Facilty/Provider(s): None  Reason for Treatment: None  Prior Outpatient Therapy: Prior Outpatient Therapy: Yes Prior Therapy Dates: 06/03/14 Prior Therapy Facilty/Provider(s): [facility]  Reason for Treatment: Med Mgt/Therapy   Current Facility-Administered Medications  Medication Dose Route Frequency Provider Last Rate Last Dose  . alum & mag hydroxide-simeth (MAALOX/MYLANTA) 200-200-20 MG/5ML suspension 30 mL  30 mL Oral PRN Carlisle Cater, PA-C      . ibuprofen (ADVIL,MOTRIN) tablet 600 mg  600  mg Oral Q8H PRN Carlisle Cater, PA-C   600 mg at 06/02/14 0916  . nicotine (NICODERM CQ - dosed in mg/24 hours) patch 21 mg  21 mg Transdermal Daily Carlisle Cater, PA-C   21 mg at 06/02/14 6147  . ondansetron (ZOFRAN) tablet 4 mg  4 mg Oral Q8H PRN Carlisle Cater, PA-C      . thiamine (VITAMIN B-1) tablet 100 mg  100 mg Oral Daily Carlisle Cater, PA-C   100 mg at 06/02/14 0929   Current Outpatient Prescriptions  Medication Sig Dispense Refill  . cyclobenzaprine (FLEXERIL) 10 MG tablet Take 1 tablet (10 mg total) by mouth 2 (two) times daily as needed for muscle  spasms. (Patient not taking: Reported on 05/31/2014) 20 tablet 0  . methocarbamol (ROBAXIN) 500 MG tablet Take 1 tablet (500 mg total) by mouth 2 (two) times daily. (Patient not taking: Reported on 05/31/2014) 20 tablet 0  . predniSONE (DELTASONE) 20 MG tablet 3 tabs po daily x 3 days, then 2 tabs x 3 days, then 1.5 tabs x 3 days, then 1 tab x 3 days, then 0.5 tabs x 3 days (Patient not taking: Reported on 05/31/2014) 27 tablet 0  . traMADol (ULTRAM) 50 MG tablet Take 1 tablet (50 mg total) by mouth every 6 (six) hours as needed. (Patient not taking: Reported on 05/31/2014) 15 tablet 0    Musculoskeletal: UTO, Camera  Psychiatric Specialty Exam:     Blood pressure 95/57, pulse 69, temperature 98.3 F (36.8 C), temperature source Oral, resp. rate 16, height '6\' 3"'  (1.905 m), weight 97.523 kg (215 lb), SpO2 98 %.Body mass index is 26.87 kg/(m^2).  General Appearance: Casual  Eye Contact::  Good  Speech:  Pressured  Volume:  Increased  Mood:  Angry and Anxious  Affect:  Blunt, Non-Congruent, Inappropriate and Labile  Thought Process:  Disorganized and Irrelevant  Orientation:  Full (Time, Place, and Person)  Thought Content:  Delusions and Hallucinations: Auditory Visual (mostly resolved)  Suicidal Thoughts:  No, denies  Homicidal Thoughts:  No  Memory:  Immediate;   Fair Recent;   Fair Remote;   Fair  Judgement:  Fair  Insight:  Fair  Psychomotor Activity:  Increased  Concentration:  Poor  Recall:  Good  Fund of Knowledge:Good  Language: Good  Akathisia:  No  Handed:    AIMS (if indicated):     Assets:  Resilience  ADL's:  Intact  Cognition: WNL  Sleep:      Medical Decision Making: Established Problem, Stable/Improving (1), Review of Psycho-Social Stressors (1) and Review or order clinical lab tests (1)   Treatment Plan Summary: Daily contact with patient to assess and evaluate symptoms and progress in treatment  Plan:  See below  Disposition:  -Send pt to [facility] on  Monday 03/28 per Social Worker Frankey Poot confirming this arrangement with pt's outpatient social worker. Pt will receive medication management and drug rehab.  Benjamine Mola, FNP-BC 06/02/2014 11:11 AM      Case discussed with me as above

## 2014-06-02 NOTE — ED Notes (Signed)
Pt on phone at nurses' desk. 

## 2014-06-02 NOTE — ED Notes (Signed)
Per Dr Rancour and Renata CapriceonradManus Gunning, NP, [facility] be [facility]/c'd.

## 2014-06-03 NOTE — Discharge Instructions (Signed)
Read the information below.  You may return to the Emergency Department at any time for worsening condition or any new symptoms that concern you.   Alcohol Use Disorder Alcohol use disorder is a mental disorder. It is not a one-time incident of heavy drinking. Alcohol use disorder is the excessive and uncontrollable use of alcohol over time that leads to problems with functioning in one or more areas of daily living. People with this disorder risk harming themselves and others when they drink to excess. Alcohol use disorder also can cause other mental disorders, such as mood and anxiety disorders, and serious physical problems. People with alcohol use disorder often misuse other drugs.  Alcohol use disorder is common and widespread. Some people with this disorder drink alcohol to cope with or escape from negative life events. Others drink to relieve chronic pain or symptoms of mental illness. People with a family history of alcohol use disorder are at higher risk of losing control and using alcohol to excess.  SYMPTOMS  Signs and symptoms of alcohol use disorder may include the following:   Consumption ofalcohol inlarger amounts or over a longer period of time than intended.  Multiple unsuccessful attempts to cutdown or control alcohol use.   A great deal of time spent obtaining alcohol, using alcohol, or recovering from the effects of alcohol (hangover).  A strong desire or urge to use alcohol (cravings).   Continued use of alcohol despite problems at work, school, or home because of alcohol use.   Continued use of alcohol despite problems in relationships because of alcohol use.  Continued use of alcohol in situations when it is physically hazardous, such as driving a car.  Continued use of alcohol despite awareness of a physical or psychological problem that is likely related to alcohol use. Physical problems related to alcohol use can involve the brain, heart, liver, stomach, and  intestines. Psychological problems related to alcohol use include intoxication, depression, anxiety, psychosis, delirium, and dementia.   The need for increased amounts of alcohol to achieve the same desired effect, or a decreased effect from the consumption of the same amount of alcohol (tolerance).  Withdrawal symptoms upon reducing or stopping alcohol use, or alcohol use to reduce or avoid withdrawal symptoms. Withdrawal symptoms include:  Racing heart.  Hand tremor.  Difficulty sleeping.  Nausea.  Vomiting.  Hallucinations.  Restlessness.  Seizures. DIAGNOSIS Alcohol use disorder is diagnosed through an assessment by your health care provider. Your health care provider may start by asking three or four questions to screen for excessive or problematic alcohol use. To confirm a diagnosis of alcohol use disorder, at least two symptoms must be present within a 60-month period. The severity of alcohol use disorder depends on the number of symptoms:  Mild--two or three.  Moderate--four or five.  Severe--six or more. Your health care provider may perform a physical exam or use results from lab tests to see if you have physical problems resulting from alcohol use. Your health care provider may refer you to a mental health professional for evaluation. TREATMENT  Some people with alcohol use disorder are able to reduce their alcohol use to low-risk levels. Some people with alcohol use disorder need to quit drinking alcohol. When necessary, mental health professionals with specialized training in substance use treatment can help. Your health care provider can help you decide how severe your alcohol use disorder is and what type of treatment you need. The following forms of treatment are available:   Detoxification. Detoxification  involves the use of prescription medicines to prevent alcohol withdrawal symptoms in the first week after quitting. This is important for people with a history of  symptoms of withdrawal and for heavy drinkers who are likely to have withdrawal symptoms. Alcohol withdrawal can be dangerous and, in severe cases, cause death. Detoxification is usually provided in a hospital or in-patient substance use treatment facility.  Counseling or talk therapy. Talk therapy is provided by substance use treatment counselors. It addresses the reasons people use alcohol and ways to keep them from drinking again. The goals of talk therapy are to help people with alcohol use disorder find healthy activities and ways to cope with life stress, to identify and avoid triggers for alcohol use, and to handle cravings, which can cause relapse.  Medicines.Different medicines can help treat alcohol use disorder through the following actions:  Decrease alcohol cravings.  Decrease the positive reward response felt from alcohol use.  Produce an uncomfortable physical reaction when alcohol is used (aversion therapy).  Support groups. Support groups are run by people who have quit drinking. They provide emotional support, advice, and guidance. These forms of treatment are often combined. Some people with alcohol use disorder benefit from intensive combination treatment provided by specialized substance use treatment centers. Both inpatient and outpatient treatment programs are available. Document Released: 04/01/2004 Document Revised: 07/09/2013 Document Reviewed: 06/01/2012 [facility] Patient Information 2015 [place], [place]. This information is not intended to replace advice given to you by your health care provider. Make sure you discuss any questions you have with your health care provider.  Drug Abuse and Addiction in Sports There are many types of drugs that one may become addicted to including illegal drugs (marijuana, cocaine, amphetamines, hallucinogens, and narcotics), prescription drugs (hydrocodone, codeine, and alprazolam), and other chemicals such as alcohol or nicotine. Two  types of addiction exist: physical and emotional. Physical addiction usually occurs after prolonged use of a drug. However, some drugs may only take a couple uses before addiction can occur. Physical addiction is marked by withdrawal symptoms in which the person experiences negative symptoms such as sweat, anxiety, tremors, hallucinations, or cravings in the absence of using the drug. Emotional dependence is the psychological desire for the "high" that the drugs produce when taken. SYMPTOMS   Inattentiveness.  Negligence.  Forgetfulness.  Insomnia.  Mood swings. RISK INCREASES WITH:   Family history of addiction.  Personal history of addictive personality. Studies have shown that risk takers, which many athletes are, have a higher risk of addiction. PREVENTION The only adequate prevention of drug abuse is abstinence from drugs. TREATMENT  The first step in quitting substance abuse is recognizing the problem and realizing that one has the power to change. Quitting requires a plan and support from others. It is often necessary to seek medical assistance. Caregivers are available to offer counseling, and for certain cases, medicine to diminish the physical symptoms of withdrawal. Many organizations exist such as Alcoholics Anonymous, Narcotics Anonymous, or the [employer] on Alcoholism that offer support for individuals who have chosen to quit their habits. Document Released: 02/22/2005 Document Revised: 07/09/2013 Document Reviewed: 06/06/2008 [facility] Patient Information 2015 [employer], [place]. This information is not intended to replace advice given to you by your health care provider. Make sure you discuss any questions you have with your health care provider.  Polysubstance Abuse When people abuse more than one drug or type of drug it is called polysubstance or polydrug abuse. For example, many smokers also drink alcohol. This is  one form of polydrug abuse. Polydrug abuse also refers  to the use of a drug to counteract an unpleasant effect produced by another drug. It may also be used to help with withdrawal from another drug. People who take stimulants may become agitated. Sometimes this agitation is countered with a tranquilizer. This helps protect against the unpleasant side effects. Polydrug abuse also refers to the use of different drugs at the same time.  Anytime drug use is interfering with normal living activities, it has become abuse. This includes problems with family and friends. Psychological dependence has developed when your mind tells you that the drug is needed. This is usually followed by physical dependence which has developed when continuing increases of drug are required to get the same feeling or "high". This is known as addiction or chemical dependency. A person's risk is much higher if there is a history of chemical dependency in the family. SIGNS OF CHEMICAL DEPENDENCY  You have been told by friends or family that drugs have become a problem.  You fight when using drugs.  You are having blackouts (not remembering what you do while using).  You feel sick from using drugs but continue using.  You lie about use or amounts of drugs (chemicals) used.  You need chemicals to get you going.  You are suffering in work performance or in school because of drug use.  You get sick from use of drugs but continue to use anyway.  You need drugs to relate to people or feel comfortable in social situations.  You use drugs to forget problems. "Yes" answered to any of the above signs of chemical dependency indicates there are problems. The longer the use of drugs continues, the greater the problems will become. If there is a family history of drug or alcohol use, it is best not to experiment with these drugs. Continual use leads to tolerance. After tolerance develops more of the drug is needed to get the same feeling. This is followed by addiction. With addiction,  drugs become the most important part of life. It becomes more important to take drugs than participate in the other usual activities of life. This includes relating to friends and family. Addiction is followed by dependency. Dependency is a condition where drugs are now needed not just to get high, but to feel normal. Addiction cannot be cured but it can be stopped. This often requires outside help and the care of professionals. Treatment centers are listed in the yellow pages under: Cocaine, Narcotics, and Alcoholics Anonymous. Most hospitals and clinics can refer you to a specialized care center. Talk to your caregiver if you need help. Document Released: 10/14/2004 Document Revised: 05/17/2011 Document Reviewed: 02/22/2005 [facility] Patient Information 2015 [place], [place]. This information is not intended to replace advice given to you by your health care provider. Make sure you discuss any questions you have with your health care provider.  Schizophrenia Schizophrenia is a mental illness. It may cause disturbed or disorganized thinking, speech, or behavior. People with schizophrenia have problems functioning in one or more areas of life: work, school, home, or relationships. People with schizophrenia are at increased risk for suicide, certain chronic physical illnesses, and unhealthy behaviors, such as smoking and drug use. People who have family members with schizophrenia are at higher risk of developing the illness. Schizophrenia affects men and women equally but usually appears at an earlier age (teenage or early adult years) in men.  SYMPTOMS The earliest symptoms are often subtle (prodrome) and  may go unnoticed until the illness becomes more severe (first-break psychosis). Symptoms of schizophrenia may be continuous or may come and go in severity. Episodes often are triggered by major life events, such as family stress, college, [employer] service, marriage, pregnancy or child birth, divorce, or loss  of a loved one. People with schizophrenia may see, hear, or feel things that do not exist (hallucinations). They may have false beliefs in spite of obvious proof to the contrary (delusions). Sometimes speech is incoherent or behavior is odd or withdrawn.  DIAGNOSIS Schizophrenia is diagnosed through an assessment by your caregiver. Your caregiver will ask questions about your thoughts, behavior, mood, and ability to function in daily life. Your caregiver may ask questions about your medical history and use of alcohol or drugs, including prescription medication. Your caregiver may also order blood tests and imaging exams. Certain medical conditions and substances can cause symptoms that resemble schizophrenia. Your caregiver may refer you to a mental health specialist for evaluation. There are three major criterion for a diagnosis of schizophrenia:  Two or more of the following five symptoms are present for a month or longer:  Delusions. Often the delusions are that you are being attacked, harassed, cheated, persecuted or conspired against (persecutory delusions).  Hallucinations.   Disorganized speech that does not make sense to others.  Grossly disorganized (confused or unfocused) behavior or extremely overactive or underactive motor activity (catatonia).  Negative symptoms such as bland or blunted emotions (flat affect), loss of will power (avolition), and withdrawal from social contacts (social isolation).  Level of functioning in one or more major areas of life (work, school, relationships, or self-care) is markedly below the level of functioning before the onset of illness.   There are continuous signs of illness (either mild symptoms or decreased level of functioning) for at least 6 months or longer. TREATMENT  Schizophrenia is a long-term illness. It is best controlled with continuous treatment rather than treatment only when symptoms occur. The following treatments are used to manage  schizophrenia:  Medication--Medication is the most effective and important form of treatment for schizophrenia. Antipsychotic medications are usually prescribed to help manage schizophrenia. Other types of medication may be added to relieve any symptoms that may occur despite the use of antipsychotic medications.  Counseling or talk therapy--Individual, group, or family counseling may be helpful in providing education, support, and guidance. Many people with schizophrenia also benefit from social skills and job skills (vocational) training. A combination of medication and counseling is best for managing the disorder over time. A procedure in which electricity is applied to the brain through the scalp (electroconvulsive therapy) may be used to treat catatonic schizophrenia or schizophrenia in people who cannot take or do not respond to medication and counseling. Document Released: 02/20/2000 Document Revised: 10/25/2012 Document Reviewed: 05/17/2012 [facility] Patient Information 2015 [place], [place]. This information is not intended to replace advice given to you by your health care provider. Make sure you discuss any questions you have with your health care provider.

## 2014-06-03 NOTE — ED Notes (Signed)
Cab called to take pt directly to [facility]

## 2014-12-03 ENCOUNTER — Emergency Department (HOSPITAL_COMMUNITY): Payer: Self-pay

## 2014-12-03 ENCOUNTER — Encounter (HOSPITAL_COMMUNITY): Payer: Self-pay | Admitting: Emergency Medicine

## 2014-12-03 ENCOUNTER — Emergency Department (HOSPITAL_COMMUNITY)
Admission: EM | Admit: 2014-12-03 | Discharge: 2014-12-03 | Disposition: A | Discharge status: Home / Self Care | Payer: Self-pay | Attending: Emergency Medicine | Admitting: Emergency Medicine

## 2014-12-03 DIAGNOSIS — Y999 Unspecified external cause status: Secondary | ICD-10-CM | POA: Insufficient documentation

## 2014-12-03 DIAGNOSIS — J45909 Unspecified asthma, uncomplicated: Secondary | ICD-10-CM | POA: Insufficient documentation

## 2014-12-03 DIAGNOSIS — Y9389 Activity, other specified: Secondary | ICD-10-CM | POA: Insufficient documentation

## 2014-12-03 DIAGNOSIS — S82431A Displaced oblique fracture of shaft of right fibula, initial encounter for closed fracture: Secondary | ICD-10-CM | POA: Insufficient documentation

## 2014-12-03 DIAGNOSIS — W1839XA Other fall on same level, initial encounter: Secondary | ICD-10-CM | POA: Insufficient documentation

## 2014-12-03 DIAGNOSIS — Z720 Tobacco use: Secondary | ICD-10-CM | POA: Insufficient documentation

## 2014-12-03 DIAGNOSIS — S82862A Displaced Maisonneuve's fracture of left leg, initial encounter for closed fracture: Secondary | ICD-10-CM

## 2014-12-03 DIAGNOSIS — Y92008 Other place in unspecified non-institutional (private) residence as the place of occurrence of the external cause: Secondary | ICD-10-CM | POA: Insufficient documentation

## 2014-12-03 DIAGNOSIS — S99911A Unspecified injury of right ankle, initial encounter: Secondary | ICD-10-CM | POA: Insufficient documentation

## 2014-12-03 DIAGNOSIS — Z8659 Personal history of other mental and behavioral disorders: Secondary | ICD-10-CM | POA: Insufficient documentation

## 2014-12-03 MED ORDER — HYDROMORPHONE HCL 1 MG/ML IJ SOLN
2.0000 mg | Freq: Once | INTRAMUSCULAR | Status: AC
  Administered 2014-12-03: 2 mg via INTRAMUSCULAR
  Filled 2014-12-03: qty 2

## 2014-12-03 MED ORDER — KETOROLAC TROMETHAMINE 60 MG/2ML IM SOLN
30.0000 mg | Freq: Once | INTRAMUSCULAR | Status: AC
  Administered 2014-12-03: 30 mg via INTRAMUSCULAR
  Filled 2014-12-03: qty 2

## 2014-12-03 MED ORDER — OXYCODONE-ACETAMINOPHEN 5-325 MG PO TABS
1.0000 | ORAL_TABLET | Freq: Once | ORAL | Status: AC
  Administered 2014-12-03: 1 via ORAL
  Filled 2014-12-03: qty 1

## 2014-12-03 MED ORDER — OXYCODONE-ACETAMINOPHEN 7.5-325 MG PO TABS: 1.0000 | ORAL_TABLET | ORAL | Status: DC | PRN

## 2014-12-03 MED ORDER — NAPROXEN 500 MG PO TABS
500.0000 mg | ORAL_TABLET | Freq: Two times a day (BID) | ORAL | Status: DC
Start: 2014-12-03 — End: 2014-12-10

## 2014-12-03 NOTE — ED Notes (Signed)
Called ortho tech, st's he will come to place splint and apply cruthes

## 2014-12-03 NOTE — Discharge Instructions (Signed)
You have broken the small bone in your lower leg, your Fibula. The break is near the knee. If is very important that you stay off the leg, elevate it take the pain medication as directed and call Dr. Linna Caprice tomorrow for follow up. This injury will require surgery and you must follow up. His office will give you the appointment.  Cast or Splint Care Casts and splints support injured limbs and keep bones from moving while they heal. It is important to care for your cast or splint at home.  HOME CARE INSTRUCTIONS  Keep the cast or splint uncovered during the drying period. It can take 24 to 48 hours to dry if it is made of plaster. A fiberglass cast will dry in less than 1 hour.  Do not rest the cast on anything harder than a pillow for the first 24 hours.  Do not put weight on your injured limb or apply pressure to the cast until your health care provider gives you permission.  Keep the cast or splint dry. Wet casts or splints can lose their shape and may not support the limb as well. A wet cast that has lost its shape can also create harmful pressure on your skin when it dries. Also, wet skin can become infected.  Cover the cast or splint with a plastic bag when bathing or when out in the rain or snow. If the cast is on the trunk of the body, take sponge baths until the cast is removed.  If your cast does become wet, dry it with a towel or a blow dryer on the cool setting only.  Keep your cast or splint clean. Soiled casts may be wiped with a moistened cloth.  Do not place any hard or soft foreign objects under your cast or splint, such as cotton, toilet paper, lotion, or powder.  Do not try to scratch the skin under the cast with any object. The object could get stuck inside the cast. Also, scratching could lead to an infection. If itching is a problem, use a blow dryer on a cool setting to relieve discomfort.  Do not trim or cut your cast or remove padding from inside of it.  Exercise  all joints next to the injury that are not immobilized by the cast or splint. For example, if you have a long leg cast, exercise the hip joint and toes. If you have an arm cast or splint, exercise the shoulder, elbow, thumb, and fingers.  Elevate your injured arm or leg on 1 or 2 pillows for the first 1 to 3 days to decrease swelling and pain.It is best if you can comfortably elevate your cast so it is higher than your heart. SEEK MEDICAL CARE IF:   Your cast or splint cracks.  Your cast or splint is too tight or too loose.  You have unbearable itching inside the cast.  Your cast becomes wet or develops a soft spot or area.  You have a bad smell coming from inside your cast.  You get an object stuck under your cast.  Your skin around the cast becomes red or raw.  You have new pain or worsening pain after the cast has been applied. SEEK IMMEDIATE MEDICAL CARE IF:   You have fluid leaking through the cast.  You are unable to move your fingers or toes.  You have discolored (blue or white), cool, painful, or very swollen fingers or toes beyond the cast.  You have tingling or numbness  around the injured area.  You have severe pain or pressure under the cast.  You have any difficulty with your breathing or have shortness of breath.  You have chest pain. Document Released: 02/20/2000 Document Revised: 12/13/2012 Document Reviewed: 08/31/2012 [facility] Patient Information 2015 [place], [place]. This information is not intended to replace advice given to you by your health care provider. Make sure you discuss any questions you have with your health care provider.

## 2014-12-03 NOTE — ED Notes (Signed)
Pt sts right ankle pain after twisting ankle yesterday; some swelling noted

## 2014-12-03 NOTE — Progress Notes (Signed)
Orthopedic Tech Progress Note Patient Details:  Daniel Downs [birth year] [id number] Ok to apply long leg splint per er doctor. Patient ID: Daniel Downs, male   DOB: 1976/02/26, 39 y.o.   MRN: [id number]   [name] 12/03/2014, 5:38 PM
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
## 2014-12-03 NOTE — ED Notes (Signed)
PT transferred from W/C to stretcher with assistance. PT reports his leg hurts so bad he can not move leg.

## 2014-12-03 NOTE — Progress Notes (Signed)
Orthopedic Tech Progress Note Patient Details:  Daniel Downs 1975-12-04 [id number]  Ortho Devices Type of Ortho Device: Ace wrap, Post (long leg) splint, Crutches Ortho Device/Splint Location: RLE Ortho Device/Splint Interventions: Ordered, Application   [name] 12/03/2014, 5:37 PM

## 2014-12-03 NOTE — ED Notes (Signed)
Pt states he wet his pants last night because he was unable to get to the bathroom. Pt tearful. Wet clothing removed and dry paper shorts put on.

## 2014-12-04 ENCOUNTER — Emergency Department (HOSPITAL_COMMUNITY)
Admission: EM | Admit: 2014-12-04 | Discharge: 2014-12-04 | Disposition: A | Discharge status: Home / Self Care | Payer: Self-pay | Attending: Emergency Medicine | Admitting: Emergency Medicine

## 2014-12-04 ENCOUNTER — Encounter (HOSPITAL_COMMUNITY): Payer: Self-pay | Admitting: *Deleted

## 2014-12-04 DIAGNOSIS — Z8659 Personal history of other mental and behavioral disorders: Secondary | ICD-10-CM | POA: Insufficient documentation

## 2014-12-04 DIAGNOSIS — Z720 Tobacco use: Secondary | ICD-10-CM | POA: Insufficient documentation

## 2014-12-04 DIAGNOSIS — J45909 Unspecified asthma, uncomplicated: Secondary | ICD-10-CM | POA: Insufficient documentation

## 2014-12-04 DIAGNOSIS — Z760 Encounter for issue of repeat prescription: Secondary | ICD-10-CM | POA: Insufficient documentation

## 2014-12-04 MED ORDER — IBUPROFEN 400 MG PO TABS
800.0000 mg | ORAL_TABLET | Freq: Once | ORAL | Status: AC
  Administered 2014-12-04: 800 mg via ORAL
  Filled 2014-12-04: qty 2

## 2014-12-04 NOTE — ED Notes (Signed)
Upon d/c pt the pt became extremely upset and began cursing at staff rt not receiving another rx for percocet or receiving any while here. PA notified. Pt ordered  ibuprofen. Pt is in stable condition upon d/c and ambulates from ED.

## 2014-12-04 NOTE — ED Notes (Signed)
Pt was here yesterday for a leg fracture. Pt states that the was not able to fill the percocet due to price. Pt states that he has now lost the prescription and needs a new one.

## 2014-12-04 NOTE — ED Provider Notes (Signed)
CSN: [id number]     Arrival date & time 12/04/14  1150 History  By signing my name below, I, Daniel Downs, attest that this documentation has been prepared under the direction and in the presence of non-physician practitioner, Dierdre Forth, PA-C. Electronically Signed: Freida Downs, Scribe. 12/04/2014. 12:52 PM.  Chief Complaint  Patient presents with  . Medication Refill   The history is provided by the patient. No language interpreter was used.    HPI Comments:  Daniel Downs is a 39 y.o. male who presents to the Emergency Department for a medication refill.  Pt was seen in the ED on 12/03/14 after a fall. He had an XR of the RLE that showed oblique fracture of the proximal right fibular diaphysis. He was discharged with RX for naproxen and percocet. He filled the naproxen but states he lost the percocet and is in the ED today to receive another prescription. At this time he complains of continued pain to his RLE. Pt has no new complaints at this time.   Past Medical History  Diagnosis Date  . Asthma   . Schizophrenia   . Back pain    History reviewed. No pertinent past surgical history. No family history on file. Social History  Substance Use Topics  . Smoking status: Current Every Day Smoker -- 2.00 packs/day    Types: Cigarettes  . Smokeless tobacco: None  . Alcohol Use: 3.0 oz/week    5 Cans of beer per week    Review of Systems  Constitutional: Negative for fever and chills.       + Medication Refill   Musculoskeletal: Positive for myalgias.    Allergies  Review of patient's allergies indicates no known allergies.  Home Medications   Prior to Admission medications   Medication Sig Start Date End Date Taking? Authorizing Provider  naproxen (NAPROSYN) 500 MG tablet Take 1 tablet (500 mg total) by mouth 2 (two) times daily. 12/03/14   Hope Orlene Och, NP  oxyCODONE-acetaminophen (PERCOCET) 7.5-325 MG tablet Take 1 tablet by mouth every 4 (four) hours as needed for  severe pain. 12/03/14   Hope Orlene Och, NP   BP 114/76 mmHg  Pulse 97  Temp(Src) 98 F (36.7 C) (Oral)  Resp 16  Ht  (1.905 m)  Wt 209 lb (94.802 kg)  BMI 26.12 kg/m2  SpO2 99% Physical Exam  Constitutional: He appears well-developed and well-nourished. No distress.  HENT:  Head: Normocephalic and atraumatic.  Eyes: Conjunctivae are normal.  Neck: Normal range of motion.  Cardiovascular: Normal rate, regular rhythm and intact distal pulses.   Capillary refill < 3 sec  Pulmonary/Chest: Effort normal and breath sounds normal.  Musculoskeletal: He exhibits tenderness. He exhibits no edema.  Posterior splint in place FROM of right toes  Neurological: He is alert. Coordination normal.  Sensation intact to toes of right foot   Skin: Skin is warm and dry. He is not diaphoretic.  No tenting of the skin  Psychiatric: He has a normal mood and affect.  Nursing note and vitals reviewed.   ED Course  Procedures   DIAGNOSTIC STUDIES:  Oxygen Saturation is 99% on RA, normal by my interpretation.    COORDINATION OF CARE:  12:39 PM Discussed treatment plan with pt at bedside and pt agreed to plan.  Labs Review Labs Reviewed - No data to display  Imaging Review Dg Tibia/fibula Right  12/03/2014   CLINICAL DATA:  Fall off from porch last night with known right fibular fracture.  EXAM: RIGHT TIBIA AND FIBULA - 2 VIEW  COMPARISON:  None.  FINDINGS: Exam demonstrates an oblique minimally displaced fracture of the proximal fibular diaphysis. Remainder of the exam is within normal.  IMPRESSION: Minimally displaced oblique fracture of the proximal fibular diaphysis.   Electronically Signed   By: Elberta Fortis M.D.   On: 12/03/2014 16:37   Dg Ankle Complete Right  12/03/2014   CLINICAL DATA:  The patient fell the night of 12/02/2014 and landed on the right lower leg. Pain and swelling. Initial encounter.  EXAM: RIGHT ANKLE - COMPLETE 3+ VIEW  COMPARISON:  None.  FINDINGS: There is no  evidence of fracture, dislocation, or joint effusion. There is no evidence of arthropathy or other focal bone abnormality. Soft tissues are unremarkable.  IMPRESSION: Negative exam.   Electronically Signed   By: Drusilla Kanner M.D.   On: 12/03/2014 14:48   Dg Knee Complete 4 Views Right  12/03/2014   CLINICAL DATA:  Right knee and ankle pain after falling last night. Initial encounter.  EXAM: RIGHT KNEE - COMPLETE 4+ VIEW  COMPARISON:  None.  FINDINGS: There is a mildly displaced oblique fracture of the proximal fibular diaphysis. The distal femur, patella and proximal tibia appear intact. The joint spaces are maintained. There is some obliquity on the lateral view, limiting assessment for joint fluid. No focal soft tissue swelling or foreign body identified.  IMPRESSION: Oblique fracture of the proximal right fibular diaphysis.   Electronically Signed   By: Carey Bullocks M.D.   On: 12/03/2014 14:50   I have personally reviewed and evaluated these images and lab results as part of my medical decision-making.   EKG Interpretation None      MDM   Final diagnoses:  Medication refill   Daniel Downs presents for additional prescription of his Percocet.  Patient reports that he "lost" his prescription.  This time it do not feel it is in the patient's best interest to right and additional Percocet prescription. I have offered other forms of pain control which she reports are not working.  I also recommended conservative therapies including elevation, ice, use of his naproxen and avoidance of walking on his leg. Patient will need to follow-up with orthopedics.  I personally performed the services described in this documentation, which was scribed in my presence. The recorded information has been reviewed and is accurate.   Dahlia Client Tyreon Frigon, PA-C 12/04/14 1509  Laurence Spates, MD 12/04/14 [phone]

## 2014-12-04 NOTE — Discharge Instructions (Signed)
1. Medications: usual home medications 2. Treatment: rest, drink plenty of fluids, elevate leg, use ice, do not walk on your leg 3. Follow Up: Please followup with your orthopedic doctor in 3 days for discussion of your diagnoses and further evaluation after today's visit;

## 2014-12-04 NOTE — ED Notes (Signed)
Pt only wants another RX written.

## 2014-12-10 ENCOUNTER — Encounter (HOSPITAL_COMMUNITY): Payer: Self-pay | Admitting: Emergency Medicine

## 2014-12-10 ENCOUNTER — Emergency Department (HOSPITAL_COMMUNITY)
Admission: EM | Admit: 2014-12-10 | Discharge: 2014-12-11 | Disposition: A | Discharge status: Home / Self Care | Payer: Self-pay | Attending: Emergency Medicine | Admitting: Emergency Medicine

## 2014-12-10 DIAGNOSIS — R45851 Suicidal ideations: Secondary | ICD-10-CM

## 2014-12-10 DIAGNOSIS — J45909 Unspecified asthma, uncomplicated: Secondary | ICD-10-CM | POA: Insufficient documentation

## 2014-12-10 DIAGNOSIS — R000 Tachycardia, unspecified: Secondary | ICD-10-CM | POA: Insufficient documentation

## 2014-12-10 DIAGNOSIS — R61 Generalized hyperhidrosis: Secondary | ICD-10-CM | POA: Insufficient documentation

## 2014-12-10 DIAGNOSIS — J029 Acute pharyngitis, unspecified: Secondary | ICD-10-CM | POA: Insufficient documentation

## 2014-12-10 DIAGNOSIS — F1994 Other psychoactive substance use, unspecified with psychoactive substance-induced mood disorder: Secondary | ICD-10-CM

## 2014-12-10 DIAGNOSIS — M79604 Pain in right leg: Secondary | ICD-10-CM | POA: Insufficient documentation

## 2014-12-10 DIAGNOSIS — F22 Delusional disorders: Secondary | ICD-10-CM | POA: Insufficient documentation

## 2014-12-10 DIAGNOSIS — Z720 Tobacco use: Secondary | ICD-10-CM | POA: Insufficient documentation

## 2014-12-10 DIAGNOSIS — F209 Schizophrenia, unspecified: Secondary | ICD-10-CM | POA: Diagnosis present

## 2014-12-10 DIAGNOSIS — R109 Unspecified abdominal pain: Secondary | ICD-10-CM | POA: Insufficient documentation

## 2014-12-10 LAB — COMPREHENSIVE METABOLIC PANEL
ALT: 23 U/L (ref 17–63)
AST: 25 U/L (ref 15–41)
Albumin: 3.8 g/dL (ref 3.5–5.0)
Alkaline Phosphatase: 80 U/L (ref 38–126)
Anion gap: 11 (ref 5–15)
BILIRUBIN TOTAL: 0.7 mg/dL (ref 0.3–1.2)
BUN: 11 mg/dL (ref 6–20)
CO2: 26 mmol/L (ref 22–32)
CREATININE: 1.67 mg/dL — AB (ref 0.61–1.24)
Calcium: 8.9 mg/dL (ref 8.9–10.3)
Chloride: 96 mmol/L — ABNORMAL LOW (ref 101–111)
GFR calc Af Amer: 59 mL/min — ABNORMAL LOW (ref >60)
GFR, EST NON AFRICAN AMERICAN: 51 mL/min — AB (ref >60)
Glucose, Bld: 104 mg/dL — ABNORMAL HIGH (ref 65–99)
Potassium: 3.8 mmol/L (ref 3.5–5.1)
Sodium: 133 mmol/L — ABNORMAL LOW (ref 135–145)
TOTAL PROTEIN: 7.7 g/dL (ref 6.5–8.1)

## 2014-12-10 LAB — CBC WITH DIFFERENTIAL/PLATELET
BASOS ABS: 0 10*3/uL (ref 0.0–0.1)
Basophils Relative: 0 %
Eosinophils Absolute: 0 10*3/uL (ref 0.0–0.7)
Eosinophils Relative: 0 %
HEMATOCRIT: 44 % (ref 39.0–52.0)
HEMOGLOBIN: 14.7 g/dL (ref 13.0–17.0)
LYMPHS PCT: 12 %
Lymphs Abs: 2 10*3/uL (ref 0.7–4.0)
MCH: 28.9 pg (ref 26.0–34.0)
MCHC: 33.4 g/dL (ref 30.0–36.0)
MCV: 86.4 fL (ref 78.0–100.0)
MONO ABS: 1.6 10*3/uL — AB (ref 0.1–1.0)
Monocytes Relative: 10 %
NEUTROS ABS: 12.8 10*3/uL — AB (ref 1.7–7.7)
Neutrophils Relative %: 78 %
Platelets: 243 10*3/uL (ref 150–400)
RBC: 5.09 MIL/uL (ref 4.22–5.81)
RDW: 13.3 % (ref 11.5–15.5)
WBC: 16.5 10*3/uL — AB (ref 4.0–10.5)

## 2014-12-10 LAB — RAPID STREP SCREEN (MED CTR MEBANE ONLY): STREPTOCOCCUS, GROUP A SCREEN (DIRECT): NEGATIVE

## 2014-12-10 MED ORDER — ACETAMINOPHEN 325 MG PO TABS
ORAL_TABLET | ORAL | Status: AC
  Filled 2014-12-10: qty 2

## 2014-12-10 MED ORDER — ACETAMINOPHEN 325 MG PO TABS
650.0000 mg | ORAL_TABLET | Freq: Once | ORAL | Status: AC
  Administered 2014-12-10: 650 mg via ORAL
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
## 2014-12-11 ENCOUNTER — Emergency Department (HOSPITAL_COMMUNITY): Payer: Self-pay

## 2014-12-11 DIAGNOSIS — F1994 Other psychoactive substance use, unspecified with psychoactive substance-induced mood disorder: Secondary | ICD-10-CM

## 2014-12-11 DIAGNOSIS — F209 Schizophrenia, unspecified: Secondary | ICD-10-CM

## 2014-12-11 LAB — URINE MICROSCOPIC-ADD ON

## 2014-12-11 LAB — RAPID URINE DRUG SCREEN, HOSP PERFORMED
AMPHETAMINES: NOT DETECTED
BENZODIAZEPINES: NOT DETECTED
Barbiturates: NOT DETECTED
COCAINE: POSITIVE — AB
OPIATES: NOT DETECTED
TETRAHYDROCANNABINOL: NOT DETECTED

## 2014-12-11 LAB — URINALYSIS, ROUTINE W REFLEX MICROSCOPIC
Glucose, UA: NEGATIVE mg/dL
Ketones, ur: 15 mg/dL — AB
Nitrite: NEGATIVE
PROTEIN: 30 mg/dL — AB
SPECIFIC GRAVITY, URINE: 1.029 (ref 1.005–1.030)
UROBILINOGEN UA: 1 mg/dL (ref 0.0–1.0)
pH: 6 (ref 5.0–8.0)

## 2014-12-11 LAB — I-STAT CG4 LACTIC ACID, ED: LACTIC ACID, VENOUS: 1.75 mmol/L (ref 0.5–2.0)

## 2014-12-11 MED ORDER — LORAZEPAM 1 MG PO TABS: 1.0000 mg | ORAL_TABLET | Freq: Three times a day (TID) | ORAL | Status: DC | PRN

## 2014-12-11 MED ORDER — OXYCODONE-ACETAMINOPHEN 5-325 MG PO TABS
1.0000 | ORAL_TABLET | Freq: Once | ORAL | Status: AC
  Administered 2014-12-11: 1 via ORAL
  Filled 2014-12-11: qty 1

## 2014-12-11 MED ORDER — PENICILLIN G BENZATHINE 1200000 UNIT/2ML IM SUSP
1.2000 10*6.[IU] | Freq: Once | INTRAMUSCULAR | Status: AC
  Administered 2014-12-11: 1.2 10*6.[IU] via INTRAMUSCULAR
  Filled 2014-12-11: qty 2

## 2014-12-11 MED ORDER — ZOLPIDEM TARTRATE 5 MG PO TABS: 5.0000 mg | ORAL_TABLET | Freq: Every evening | ORAL | Status: DC | PRN

## 2014-12-11 MED ORDER — ONDANSETRON HCL 4 MG PO TABS: 4.0000 mg | ORAL_TABLET | Freq: Three times a day (TID) | ORAL | Status: DC | PRN

## 2014-12-11 MED ORDER — ACETAMINOPHEN 325 MG PO TABS: 650.0000 mg | ORAL_TABLET | Freq: Four times a day (QID) | ORAL | Status: DC | PRN

## 2014-12-11 MED ORDER — HYDROCODONE-ACETAMINOPHEN 5-325 MG PO TABS
1.0000 | ORAL_TABLET | Freq: Three times a day (TID) | ORAL | Status: DC
  Administered 2014-12-11: 1 via ORAL
  Filled 2014-12-11: qty 1

## 2014-12-11 MED ORDER — IBUPROFEN 200 MG PO TABS: 600.0000 mg | ORAL_TABLET | Freq: Three times a day (TID) | ORAL | Status: DC

## 2014-12-11 MED ORDER — NICOTINE 21 MG/24HR TD PT24
21.0000 mg | MEDICATED_PATCH | Freq: Once | TRANSDERMAL | Status: DC
  Administered 2014-12-11: 21 mg via TRANSDERMAL
  Filled 2014-12-11: qty 1

## 2014-12-11 MED ORDER — SODIUM CHLORIDE 0.9 % IV BOLUS (SEPSIS)
2000.0000 mL | Freq: Once | INTRAVENOUS | Status: AC
  Administered 2014-12-11: 2000 mL via INTRAVENOUS

## 2014-12-11 MED ORDER — OXYCODONE-ACETAMINOPHEN 5-325 MG PO TABS: 1.0000 | ORAL_TABLET | ORAL | Status: DC | PRN

## 2014-12-11 MED ORDER — KETOROLAC TROMETHAMINE 30 MG/ML IJ SOLN: 30.0000 mg | Freq: Once | INTRAMUSCULAR | Status: DC

## 2014-12-11 MED ORDER — ACETAMINOPHEN 325 MG PO TABS
650.0000 mg | ORAL_TABLET | Freq: Once | ORAL | Status: AC
  Administered 2014-12-11: 650 mg via ORAL
  Filled 2014-12-11: qty 2

## 2014-12-11 MED ORDER — IBUPROFEN 800 MG PO TABS
800.0000 mg | ORAL_TABLET | Freq: Three times a day (TID) | ORAL | Status: DC
Start: 2014-12-11 — End: 2014-12-11
  Administered 2014-12-11: 800 mg via ORAL
  Filled 2014-12-11: qty 1

## 2014-12-11 NOTE — ED Notes (Signed)
Pt wanded by security. TTS machine at bedside.

## 2014-12-11 NOTE — ED Provider Notes (Signed)
Medically clear. cxr clear. Urine without signs of infection. Viral illness with fever. Well appearing. BP improved. Medically clear. Continue to manage fever with ibuprofen and tylenol. Meets inpatient criteria per TTS  Azalia Bilis, MD 12/11/14 [phone]

## 2014-12-11 NOTE — Discharge Instructions (Signed)
Suicidal Feelings: How to Help Yourself °Suicide is the taking of one's own life. If you feel as though life is getting too tough to handle and are thinking about suicide, get help right away. To get help: °· Call your local emergency services (911 in the [place] °· Call a suicide hotline to speak with a trained counselor who understands how you are feeling. The following is a list of suicide hotlines in the [place]. For a list of hotlines in [place], visit www.suicide.org/hotlines/international/[place]-suicide-hotlines.html. °¨  1-800-273-TALK (1-800-273-8255). °¨  1-800-SUICIDE (1-800-784-2433). °¨  1-888-628-9454. This is a hotline for Spanish speakers. °¨  1-800-799-4TTY (1-800-799-4889). This is a hotline for TTY users. °¨  1-866-4-U-TREVOR (1-866-488-7386). This is a hotline for lesbian, gay, bisexual, transgender, or questioning youth. °· Contact a crisis center or a local suicide prevention center. To find a crisis center or suicide prevention center: °¨ Call your local hospital, clinic, community service organization, mental health center, social service provider, or health department. Ask for assistance in connecting to a crisis center. °¨ Visit www.suicidepreventionlifeline.org/getinvolved/locator for a list of crisis centers in the [place], or visit www.suicideprevention.[place]/thinking-about-suicide/find-a-crisis-centre for a list of centers in [place]. °· Visit the following websites: °¨  National Suicide Prevention Lifeline: www.suicidepreventionlifeline.org °¨  Hopeline: www.hopeline.com °¨  American Foundation for Suicide Prevention: www.afsp.org °¨  The Trevor Project (for lesbian, gay, bisexual, transgender, or questioning youth): www.thetrevorproject.org °HOW CAN I HELP MYSELF FEEL BETTER? °· Promise yourself that you will not do anything drastic when you have suicidal feelings. Remember, there is hope. Many people have gotten through suicidal thoughts and feelings, and you will, too. You may  have gotten through them before, and this proves that you can get through them again. °· Let family, friends, teachers, or counselors know how you are feeling. Try not to isolate yourself from those who care about you. Remember, they will want to help you. Talk with someone every day, even if you do not feel sociable. Face-to-face conversation is best. °· Call a mental health professional and see one regularly. °· Visit your primary health care provider every year. °· Eat a well-balanced diet, and space your meals so you eat regularly. °· Get plenty of rest. °· Avoid alcohol and drugs, and remove them from your home. They will only make you feel worse. °· If you are thinking of taking a lot of medicine, give your medicine to someone who can give it to you one day at a time. If you are on antidepressants and are concerned you will overdose, let your health care provider know so he or she can give you safer medicines. Ask your mental health professional about the possible side effects of any medicines you are taking. °· Remove weapons, poisons, knives, and anything else that could harm you from your home. °· Try to stick to routines. Follow a schedule every day. Put self-care on your schedule. °· Make a list of realistic goals, and cross them off when you achieve them. Accomplishments give a sense of worth. °· Wait until you are feeling better before doing the things you find difficult or unpleasant. °· Exercise if you are able. You will feel better if you exercise for even a half hour each day. °· Go out in the sun or into nature. This will help you recover from depression faster. If you have a favorite place to walk, go there. °· Do the things that have always given you pleasure. Play your favorite music, read a good book, paint a picture, play your favorite instrument, or do anything   else that takes your mind off your depression if it is safe to do.  Keep your living space well lit.  When you are feeling well,  write yourself a letter about tips and support that you can read when you are not feeling well.  Remember that life's difficulties can be sorted out with help. Conditions can be treated. You can work on thoughts and strategies that serve you well.   This information is not intended to replace advice given to you by your health care provider. Make sure you discuss any questions you have with your health care provider.   Document Released: 08/29/2002 Document Revised: 03/15/2014 Document Reviewed: 06/19/2013 Elsevier Interactive Patient Education 2016 [employer] Tibial Fracture, Adult A tibial fracture is a break in the larger bone of your lower leg (tibia). This bone is also called the shin bone. CAUSES   Low-energy injuries, such as a fall from ground level.   High-energy injuries, such as motor vehicle injuries or high-speed sports collisions. RISK FACTORS  Jumping activities.   Repetitive stress, such as long-distance running.   Participation in sports.   Osteoporosis.   Advanced age.  SIGNS AND SYMPTOMS  Pain.   Swelling.   Inability to put weight on your injured leg.   Bone deformities at the site of your injury.   Bruising.  DIAGNOSIS  A tibial fracture can usually be diagnosed using X-rays. TREATMENT  A tibial fracture will often be treated with simple immobilization. A cast or splint will be used on your leg to keep it from moving while it heals. If the injury caused parts of the bone to move out of place, your health care provider may reposition those parts before putting on your cast or splint. The cast or splint will remain in place until your health care provider thinks the bone has healed well enough. Then you can begin range-of-motion exercises to regain your knee motion. For severe injuries, surgery is sometimes needed to insert plates or screws into the injured area. HOME CARE INSTRUCTIONS   If you have a plaster or fiberglass cast:   Do not  try to scratch the skin under the cast using sharp or pointed objects.   Check the skin around the cast every day. You may put lotion on any red or sore areas.   Keep your cast dry and clean.   If you have a plaster splint:   Wear the splint as directed.   Loosen the elastic around the splint if your toes become numb, tingle, or turn cold or blue.   Do not put pressure on any part of your cast or splint until it is fully hardened.   Use a plastic bag to protect your cast or splint during bathing. Do not lower the cast or splint into water.   Use crutches as directed.   Take medicines only as directed by your health care provider.   Keep all follow-up visits as directed by your health care provider. This is important.  SEEK MEDICAL CARE IF:  Your pain is becoming worse rather than better or is not controlled with medicines.   You have increased swelling or redness in your foot.   You begin to lose feeling in your foot or toes.  SEEK IMMEDIATE MEDICAL CARE IF:   Your foot or toes on the injured side feel cold or turn blue.   You develop severe pain in your injured leg, especially if the pain is increased with movement of your toes.  MAKE SURE YOU:  Understand these instructions.   Will watch your condition.   Will get help right away if you are not doing well or get worse.    This information is not intended to replace advice given to you by your health care provider. Make sure you discuss any questions you have with your health care provider.   Document Released: 11/17/2000 Document Revised: 07/09/2014 Document Reviewed: 04/18/2013 Elsevier Interactive Patient Education [employer]

## 2014-12-11 NOTE — ED Notes (Signed)
Tele-psych completed, pt to be dc
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
## 2014-12-11 NOTE — Progress Notes (Signed)
Orthopedic Tech Progress Note Patient Details:  Daniel Downs 31-Mar-1975 [id number] Posterior splint applied to RLE. Splint made longer than regular SLS would typically be made (stopping just below the bend on the knee) in order to insure protection and support of proximal fracture.  Ortho Devices Type of Ortho Device: Ace wrap, Post (short leg) splint Ortho Device/Splint Location: RLE Ortho Device/Splint Interventions: Application   [name] 12/11/2014, 4:47 AM

## 2014-12-11 NOTE — ED Notes (Signed)
Patient given a sprite

## 2014-12-11 NOTE — ED Provider Notes (Signed)
CSN: [id number]     Arrival date & time 12/10/14  2133 History  By signing my name below, I, Tanda Rockers, attest that this documentation has been prepared under the direction and in the presence of Derwood Kaplan, MD. Electronically Signed: Tanda Rockers, ED Scribe. 12/11/2014. 2:42 AM.     Chief Complaint  Patient presents with  . Sore Throat   The history is provided by the patient. No language interpreter was used.     HPI Comments: Daniel Downs is a 39 y.o. male with hx schizophrenia who presents to the Emergency Department complaining of gradual onset, hot flashes and chills x 1 day. Pt also complains of sore throat, abdominal pain, and right leg pain. He notes voice changes and trouble swallowing with the sore throat as well. Pt broke his fibula approximately 1 week ago, which is why he is complaining of right leg pain. He states he has not been wearing his knee splint because it got wet. Pt has been trying to follow up with an orthopedist and called 2 days ago but has not heard back yet. Denies cough, fever, nausea, vomiting, diarrhea, or any other associated symptoms. + headache, no neck pain.  Past Medical History  Diagnosis Date  . Asthma   . Schizophrenia (HCC)   . Back pain    History reviewed. No pertinent past surgical history. No family history on file. Social History  Substance Use Topics  . Smoking status: Current Every Day Smoker -- 2.00 packs/day    Types: Cigarettes  . Smokeless tobacco: None  . Alcohol Use: 3.0 oz/week    5 Cans of beer per week    Review of Systems  Constitutional: Positive for chills and diaphoresis. Negative for fever.  HENT: Positive for sore throat, trouble swallowing and voice change.   Respiratory: Negative for cough.   Gastrointestinal: Positive for abdominal pain. Negative for nausea, vomiting and diarrhea.  Musculoskeletal: Positive for arthralgias (Right leg pain).  Psychiatric/Behavioral: Negative for suicidal ideas,  hallucinations and self-injury.     ROS 10 Systems reviewed and are negative for acute change except as noted in the HPI.     Allergies  Review of patient's allergies indicates no known allergies.  Home Medications   Prior to Admission medications   Not on File   Triage VItals: BP 117/62 mmHg  Pulse 95  Temp(Src) 99 F (37.2 C) (Oral)  Resp 19  SpO2 100%   Physical Exam  Constitutional: He is oriented to person, place, and time. He appears well-developed and well-nourished. No distress.  HENT:  Head: Normocephalic and atraumatic.  Right sided tonsillar enlargement with exudate  Eyes: Conjunctivae and EOM are normal.  Neck: Neck supple. No tracheal deviation present.  Positive left sided anterior cervical lymphadenopathy, no meningismus  Cardiovascular: Regular rhythm.  Tachycardia present.   No murmur heard. 2+ DP pulse in right lower extremity  Pulmonary/Chest: Effort normal and breath sounds normal. No respiratory distress. He has no wheezes. He has no rhonchi. He has no rales.  Musculoskeletal: Normal range of motion.  Lymphadenopathy:    He has cervical adenopathy.  Neurological: He is alert and oriented to person, place, and time.  Skin: Skin is warm and dry.  Skin is warm to touch  Psychiatric: He has a normal mood and affect. His behavior is normal.  Nursing note and vitals reviewed.   ED Course  Procedures (including critical care time)  DIAGNOSTIC STUDIES: Oxygen Saturation is 100% on RA, normal by my interpretation.  COORDINATION OF CARE: 1:01 AM-Discussed treatment plan which includes pain medication in ED, CBC, CMP, Rapid strep screen,  with pt at bedside and pt agreed to plan.   2:35 AM - Upon reassessment pt is asking to talk with behavioral health team. Pt states he is not mentally stable enough to go home and feels like "someone is trying to kill me" due to having his hot flashes and chills. Pt does not have a plan at the moment.    7:30 am:  Will give him ivf and get lactate and CXR. His fever spiked again and BP is in the 90s. Just trying to be thorough given he is a psych patient. Pt has no new complains. I dont think pt has PE.  Labs Review Labs Reviewed  CBC WITH DIFFERENTIAL/PLATELET - Abnormal; Notable for the following:    WBC 16.5 (*)    Neutro Abs 12.8 (*)    Monocytes Absolute 1.6 (*)    All other components within normal limits  COMPREHENSIVE METABOLIC PANEL - Abnormal; Notable for the following:    Sodium 133 (*)    Chloride 96 (*)    Glucose, Bld 104 (*)    Creatinine, Ser 1.67 (*)    GFR calc non Af Amer 51 (*)    GFR calc Af Amer 59 (*)    All other components within normal limits  URINALYSIS, ROUTINE W REFLEX MICROSCOPIC (NOT AT [facility]) - Abnormal; Notable for the following:    Color, Urine AMBER (*)    APPearance TURBID (*)    Hgb urine dipstick MODERATE (*)    Bilirubin Urine SMALL (*)    Ketones, ur 15 (*)    Protein, ur 30 (*)    Leukocytes, UA SMALL (*)    All other components within normal limits  URINE RAPID DRUG SCREEN, HOSP PERFORMED - Abnormal; Notable for the following:    Cocaine POSITIVE (*)    All other components within normal limits  URINE MICROSCOPIC-ADD ON - Abnormal; Notable for the following:    Squamous Epithelial / LPF FEW (*)    Bacteria, UA FEW (*)    All other components within normal limits  RAPID STREP SCREEN (NOT AT [facility])  CULTURE, GROUP A STREP  I-STAT CG4 LACTIC ACID, ED    Imaging Review No results found. I have personally reviewed and evaluated these lab results as part of my medical decision-making.   EKG Interpretation None      MDM   Final diagnoses:  Paranoid (HCC)  Pharyngitis  Suicidal ideation   I personally performed the services described in this documentation, which was scribed in my presence. The recorded information has been reviewed and is accurate.   Pt comes in with cc of sore throat. He has a fever and WC, he is also tachycardic. He has  an exudate in the tonsillar region. Suspect the source of infection is the throat. We will give him IM penicillin - it is possible that he has a false neg rapid strep.  Pt has a mild cough, lungs are clear. He has no signs of infection around his broken leg. No meningismus, + headache - but no other red flags concerning for meningeal process in otherwise healthy and immunocompetent male.  Pt also has SI and endorses paranoia. Psych consulted.      Derwood Kaplan, MD 12/11/14 [phone]
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
## 2014-12-11 NOTE — Progress Notes (Signed)
Spoke with May, NP and Dr. Lucianne Muss. Pt has been evaluated by telepsychiatry today and is recommended he is stable for d/c.  Pt has received medication management services in the past through [facility], states he has not attended appointments or taken medications in over 8 months. Provided [facility] information in pt's d/c instructions as he will need to present to walk-in clinic for assessment in order to become re-established.   Ilean Skill, MSW, LCSW Clinical Social Work, Disposition  12/11/2014 [phone]

## 2014-12-11 NOTE — ED Notes (Signed)
Telepsych in progress. 

## 2014-12-14 LAB — CULTURE, GROUP A STREP

## 2015-12-21 ENCOUNTER — Emergency Department (HOSPITAL_COMMUNITY)
Admission: EM | Admit: 2015-12-21 | Discharge: 2015-12-21 | Disposition: A | Discharge status: Home / Self Care | Payer: Self-pay | Attending: Emergency Medicine | Admitting: Emergency Medicine

## 2015-12-21 ENCOUNTER — Encounter (HOSPITAL_COMMUNITY): Payer: Self-pay | Admitting: Emergency Medicine

## 2015-12-21 ENCOUNTER — Emergency Department (HOSPITAL_COMMUNITY): Payer: Self-pay

## 2015-12-21 DIAGNOSIS — M5030 Other cervical disc degeneration, unspecified cervical region: Secondary | ICD-10-CM | POA: Insufficient documentation

## 2015-12-21 DIAGNOSIS — R109 Unspecified abdominal pain: Secondary | ICD-10-CM | POA: Insufficient documentation

## 2015-12-21 DIAGNOSIS — M5136 Other intervertebral disc degeneration, lumbar region: Secondary | ICD-10-CM | POA: Insufficient documentation

## 2015-12-21 DIAGNOSIS — J45909 Unspecified asthma, uncomplicated: Secondary | ICD-10-CM | POA: Insufficient documentation

## 2015-12-21 DIAGNOSIS — F17210 Nicotine dependence, cigarettes, uncomplicated: Secondary | ICD-10-CM | POA: Insufficient documentation

## 2015-12-21 MED ORDER — DIAZEPAM 5 MG PO TABS
10.0000 mg | ORAL_TABLET | Freq: Once | ORAL | Status: AC
  Administered 2015-12-21: 10 mg via ORAL
  Filled 2015-12-21: qty 2

## 2015-12-21 MED ORDER — DICLOFENAC SODIUM 75 MG PO TBEC: 75.0000 mg | DELAYED_RELEASE_TABLET | Freq: Two times a day (BID) | ORAL | 0 refills | Status: AC

## 2015-12-21 MED ORDER — HYDROCODONE-ACETAMINOPHEN 5-325 MG PO TABS
2.0000 | ORAL_TABLET | Freq: Once | ORAL | Status: AC
  Administered 2015-12-21: 2 via ORAL
  Filled 2015-12-21: qty 2

## 2015-12-21 MED ORDER — DEXAMETHASONE 4 MG PO TABS: 4.0000 mg | ORAL_TABLET | Freq: Two times a day (BID) | ORAL | 0 refills | Status: AC

## 2015-12-21 MED ORDER — CYCLOBENZAPRINE HCL 10 MG PO TABS: 10.0000 mg | ORAL_TABLET | Freq: Three times a day (TID) | ORAL | 0 refills | Status: AC

## 2015-12-21 MED ORDER — DEXAMETHASONE SODIUM PHOSPHATE 4 MG/ML IJ SOLN
8.0000 mg | Freq: Once | INTRAMUSCULAR | Status: AC
  Administered 2015-12-21: 8 mg via INTRAMUSCULAR
  Filled 2015-12-21: qty 2

## 2015-12-21 NOTE — ED Triage Notes (Signed)
Pt with c/o lower back pain for 1 week. Denies injury. States pain radiates to R neck and R arm. Pt also has abscess to R thumb and is unsure if he was bitten by something.

## 2015-12-21 NOTE — Discharge Instructions (Signed)
Advanced degenerative disc disease involving your cervical spine. There is also degenerative disc disease involving your lumbar spine. Please see Dr. Carola FrostHandy or member of his team as soon as possible especially concerning your cervical spine. Use Decadron, Flexeril, and diclofenac daily to assist with your discomfort. Heating pad to your neck, shoulders, and back maybe helpful.

## 2015-12-21 NOTE — ED Provider Notes (Signed)
[facility]-EMERGENCY DEPT Provider Note   CSN: [id number] Arrival date & time: 12/21/15  1925  By signing my name below, I, Daniel Downs, attest that this documentation has been prepared under the direction and in the presence of Daniel QualeHobson Amzie Sillas, PA-C. Electronically Signed: [name], Scribe. 12/21/2015. 8:49 PM.  History   Chief Complaint Chief Complaint  Patient presents with  . Back Pain    The history is provided by the patient. No language interpreter was used.  Back Pain   This is a new problem. The current episode started more than 2 days ago. The problem occurs constantly. The pain is present in the lumbar spine. The pain does not radiate. The pain is severe. Associated symptoms include abdominal pain (right side). Pertinent negatives include no fever, no numbness and no weakness.     HPI Comments: Daniel Downs is a 40 y.o. male with PMHx significant for back pain who presents to the Emergency Department complaining of sudden onset, constant, severe, lower back pain for the last 5 days. He notes associated burning, right-sided abdominal pain as well as right shoulder pain. Pt notes h/o degenerative disc problems in his lower back as well as arthritis in his lower back; he denies h/o back surgery. He reports back pain exacerbation with standing and some alleviation with laying flat. He reports he experiences intermittent lower back pain at baseline but his current pain is more severe. Pt notes he has seen a sports medicine doctor in the past for back pain. He denies numbness, weakness, or any other associated symptoms.  Pt also complains of a right thumb abscess beginning a few days ago. He states he bites his fingernails and believes this could be the cause of his abscess. He denies fever, chills, or any other associated symptoms.  Past Medical History:  Diagnosis Date  . Asthma   . Back pain   . Schizophrenia [facility](HCC)     Patient Active Problem List   Diagnosis Date Noted  .  Drug-induced mood disorder (HCC) 12/11/2014  . Schizophrenia, unspecified type (HCC)     History reviewed. No pertinent surgical history.     Home Medications    Prior to Admission medications   Not on File    Family History No family history on file.  Social History Social History  Substance Use Topics  . Smoking status: Current Every Day Smoker    Packs/day: 2.00    Types: Cigarettes  . Smokeless tobacco: Never Used  . Alcohol use 3.0 oz/week    5 Cans of beer per week     Allergies   Review of patient's allergies indicates no known allergies.   Review of Systems Review of Systems  Constitutional: Negative for chills and fever.  Gastrointestinal: Positive for abdominal pain (right side).  Musculoskeletal: Positive for back pain (lower) and myalgias (right shoulder).  Skin: Positive for wound (right thumb abscess).  Neurological: Negative for weakness and numbness.  All other systems reviewed and are negative.   Physical Exam Updated Vital Signs BP 123/80 (BP Location: Left Arm)   Pulse 97   Temp 98 F (36.7 C) (Oral)   Resp 20   Ht 6\' 2"  (1.88 m)   Wt 210 lb (95.3 kg)   SpO2 100%   BMI 26.96 kg/m   Physical Exam  Constitutional: He is oriented to person, place, and time. He appears well-developed and well-nourished. No distress.  HENT:  Head: Normocephalic and atraumatic.  Eyes: Conjunctivae and EOM are normal.  Neck:  Neck supple. No tracheal deviation present.  Cardiovascular: Normal rate.   Pulmonary/Chest: Effort normal. No respiratory distress.  Musculoskeletal: Normal range of motion.  Pain of the lower lumbar with standing. No palpable step off. No palpable step off of the cervical spine.  Neurological: He is alert and oriented to person, place, and time.  Skin: Skin is warm and dry.  Psychiatric: He has a normal mood and affect. His behavior is normal.  Nursing note and vitals reviewed.   ED Treatments / Results  Labs (all labs  ordered are listed, but only abnormal results are displayed) Labs Reviewed - No data to display  EKG  EKG Interpretation None       Radiology No results found.  Procedures Procedures (including critical care time)  DIAGNOSTIC STUDIES: Oxygen Saturation is 100% on RA, normal by my interpretation.    COORDINATION OF CARE: 9:17 PM Discussed treatment plan with pt at bedside and pt agreed to plan.  Medications Ordered in ED Medications  dexamethasone (DECADRON) injection 8 mg (not administered)  HYDROcodone-acetaminophen (NORCO/VICODIN) 5-325 MG per tablet 2 tablet (not administered)  diazepam (VALIUM) tablet 10 mg (not administered)     Initial Impression / Assessment and Plan / ED Course  I have reviewed the triage vital signs and the nursing notes.  Pertinent labs & imaging results that were available during my care of the patient were reviewed by me and considered in my medical decision making (see chart for details).  Clinical Course    **I have reviewed nursing notes, vital signs, and all appropriate lab and imaging results for this patient.* **I personally performed the services described in this documentation, which was scribed in my presence. The recorded information has been reviewed and is accurate.*  Final Clinical Impressions(s) / ED Diagnoses  CT scan of the cervical spine reveals cervical spondylosis predominantly at the C5-C6 and C6-C7 levels. There is discogenic changes present there and some moderate canal stenosis present. The lumbar spine CT shows some bulging disc problems at the L5-S1 area. There are osteophytes present at multiple levels. I discussed these findings with the patient. I discussed the importance of him contacting the orthopedic surgeon as soon as possible specially concerning the findings on the CT scan of the neck. The patient will be treated with Flexeril, diclofenac, and Decadron. Patient feeling some better after medications have been  given.    Final diagnoses:  DDD (degenerative disc disease), cervical  DDD (degenerative disc disease), lumbar    New Prescriptions New Prescriptions   No medications on file     Daniel Quale, PA-C 12/21/15 2259    Samuel Jester, DO 12/23/15 2024

## 2016-01-27 ENCOUNTER — Emergency Department (HOSPITAL_COMMUNITY): Payer: Self-pay

## 2016-01-27 ENCOUNTER — Emergency Department (HOSPITAL_COMMUNITY)
Admission: EM | Admit: 2016-01-27 | Discharge: 2016-01-27 | Disposition: A | Discharge status: Home / Self Care | Payer: Self-pay | Attending: Emergency Medicine | Admitting: Emergency Medicine

## 2016-01-27 ENCOUNTER — Encounter (HOSPITAL_COMMUNITY): Payer: Self-pay

## 2016-01-27 DIAGNOSIS — Y939 Activity, unspecified: Secondary | ICD-10-CM | POA: Insufficient documentation

## 2016-01-27 DIAGNOSIS — Y999 Unspecified external cause status: Secondary | ICD-10-CM | POA: Insufficient documentation

## 2016-01-27 DIAGNOSIS — S61214A Laceration without foreign body of right ring finger without damage to nail, initial encounter: Secondary | ICD-10-CM | POA: Insufficient documentation

## 2016-01-27 DIAGNOSIS — Z79899 Other long term (current) drug therapy: Secondary | ICD-10-CM | POA: Insufficient documentation

## 2016-01-27 DIAGNOSIS — J45909 Unspecified asthma, uncomplicated: Secondary | ICD-10-CM | POA: Insufficient documentation

## 2016-01-27 DIAGNOSIS — Y929 Unspecified place or not applicable: Secondary | ICD-10-CM | POA: Insufficient documentation

## 2016-01-27 DIAGNOSIS — W268XXA Contact with other sharp object(s), not elsewhere classified, initial encounter: Secondary | ICD-10-CM | POA: Insufficient documentation

## 2016-01-27 DIAGNOSIS — F17210 Nicotine dependence, cigarettes, uncomplicated: Secondary | ICD-10-CM | POA: Insufficient documentation

## 2016-01-27 DIAGNOSIS — Z23 Encounter for immunization: Secondary | ICD-10-CM | POA: Insufficient documentation

## 2016-01-27 DIAGNOSIS — S61512A Laceration without foreign body of left wrist, initial encounter: Secondary | ICD-10-CM | POA: Insufficient documentation

## 2016-01-27 MED ORDER — OXYCODONE-ACETAMINOPHEN 5-325 MG PO TABS
1.0000 | ORAL_TABLET | ORAL | Status: DC | PRN
  Administered 2016-01-27: 1 via ORAL
  Filled 2016-01-27: qty 1

## 2016-01-27 MED ORDER — OXYCODONE-ACETAMINOPHEN 5-325 MG PO TABS
ORAL_TABLET | ORAL | Status: AC
  Administered 2016-01-27: 1
  Filled 2016-01-27: qty 1

## 2016-01-27 MED ORDER — TETANUS-DIPHTH-ACELL PERTUSSIS 5-2.5-18.5 LF-MCG/0.5 IM SUSP
0.5000 mL | Freq: Once | INTRAMUSCULAR | Status: AC
  Administered 2016-01-27: 0.5 mL via INTRAMUSCULAR
  Filled 2016-01-27: qty 0.5

## 2016-01-27 MED ORDER — LIDOCAINE HCL (PF) 1 % IJ SOLN
30.0000 mL | Freq: Once | INTRAMUSCULAR | Status: AC
  Administered 2016-01-27: 30 mL via INTRADERMAL
  Filled 2016-01-27: qty 30
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
## 2016-01-27 NOTE — ED Notes (Signed)
ED Provider at bedside. 

## 2016-01-27 NOTE — ED Triage Notes (Signed)
Per pt, Pt is coming from home with complaints of hand and wrist lacerations due to swing hands into a window secondary to using the sledge hammer. Pt has good pulses, good capillary refill and movement of hands bilaterally.

## 2016-01-27 NOTE — ED Notes (Signed)
Patient soaking hand in peroxide/saline, per PA Rose's request.

## 2016-01-27 NOTE — Discharge Instructions (Signed)
Return to the emergency department in 10 days for suture removal. You may take 800 mg ibuprofen 3 times daily or 1000 mg of Tylenol every 6 hours for pain. Return sooner if you experience pus drainage, surrounding redness or warmth, fever, or any new or concerning symptoms.

## 2016-12-15 ENCOUNTER — Emergency Department (HOSPITAL_COMMUNITY): Payer: Self-pay

## 2016-12-15 ENCOUNTER — Emergency Department (HOSPITAL_COMMUNITY)
Admission: EM | Admit: 2016-12-15 | Discharge: 2016-12-16 | Disposition: A | Discharge status: Home / Self Care | Payer: Self-pay | Attending: Emergency Medicine | Admitting: Emergency Medicine

## 2016-12-15 ENCOUNTER — Encounter (HOSPITAL_COMMUNITY): Payer: Self-pay | Admitting: Emergency Medicine

## 2016-12-15 DIAGNOSIS — J45909 Unspecified asthma, uncomplicated: Secondary | ICD-10-CM | POA: Insufficient documentation

## 2016-12-15 DIAGNOSIS — F209 Schizophrenia, unspecified: Secondary | ICD-10-CM | POA: Insufficient documentation

## 2016-12-15 DIAGNOSIS — W208XXA Other cause of strike by thrown, projected or falling object, initial encounter: Secondary | ICD-10-CM | POA: Insufficient documentation

## 2016-12-15 DIAGNOSIS — Y9389 Activity, other specified: Secondary | ICD-10-CM | POA: Insufficient documentation

## 2016-12-15 DIAGNOSIS — F17210 Nicotine dependence, cigarettes, uncomplicated: Secondary | ICD-10-CM | POA: Insufficient documentation

## 2016-12-15 DIAGNOSIS — Y998 Other external cause status: Secondary | ICD-10-CM | POA: Insufficient documentation

## 2016-12-15 DIAGNOSIS — S9031XA Contusion of right foot, initial encounter: Secondary | ICD-10-CM | POA: Insufficient documentation

## 2016-12-15 DIAGNOSIS — Y929 Unspecified place or not applicable: Secondary | ICD-10-CM | POA: Insufficient documentation

## 2016-12-15 MED ORDER — IBUPROFEN 800 MG PO TABS
800.0000 mg | ORAL_TABLET | Freq: Once | ORAL | Status: AC
  Administered 2016-12-15: 800 mg via ORAL
  Filled 2016-12-15: qty 1

## 2016-12-15 NOTE — ED Provider Notes (Signed)
[facility]-EMERGENCY DEPT Provider Note   CSN: [id number] Arrival date & time: 12/15/16  1334     History   Chief Complaint Chief Complaint  Patient presents with  . Foot Injury    HPI Daniel Downs is a 41 y.o. male.  HPI   Patient has a PMH of asthma, back pain and schizophrenia he presents to the ER for evaluation of right foot pain. This morning he was carrying a metal filling cabinet and accidentally dropped it on his right foot causing severe pain. He took a cab over because it hurts to ambulate on it. He denies that it hit him in the head or that the cabinet fell over onto him or syncope.  He appears intoxicated or altered by a substance but he tells me he is just sleepy.  Denies skin injury, numbness, inability to move foot  Past Medical History:  Diagnosis Date  . Asthma   . Back pain   . Schizophrenia Daniel Downs)     Patient Active Problem List   Diagnosis Date Noted  . Drug-induced mood disorder (HCC) 12/11/2014  . Schizophrenia, unspecified type (HCC)     History reviewed. No pertinent surgical history.     Home Medications    Prior to Admission medications   Medication Sig Start Date End Date Taking? Authorizing Provider  cyclobenzaprine (FLEXERIL) 10 MG tablet Take 1 tablet (10 mg total) by mouth 3 (three) times daily. Patient not taking: Reported on 01/27/2016 12/21/15   Daniel Quale, PA-C  dexamethasone (DECADRON) 4 MG tablet Take 1 tablet (4 mg total) by mouth 2 (two) times daily with a meal. Patient not taking: Reported on 01/27/2016 12/21/15   Daniel Quale, PA-C  diclofenac (VOLTAREN) 75 MG EC tablet Take 1 tablet (75 mg total) by mouth 2 (two) times daily. Patient not taking: Reported on 01/27/2016 12/21/15   Daniel Quale, PA-C    Family History No family history on file.  Social History Social History  Substance Use Topics  . Smoking status: Current Every Day Smoker    Packs/day: 0.50    Types: Cigarettes  . Smokeless tobacco: Never Used    . Alcohol use 3.0 oz/week    5 Cans of beer per week     Allergies   Patient has no known allergies.   Review of Systems Review of Systems Negative ROS aside from pertinent positives and negatives as listed in HPI   Physical Exam Updated Vital Signs BP (!) 150/60 (BP Location: Right Arm)   Pulse 99   Temp 98 F (36.7 C) (Oral)   Resp 16   SpO2 100%   Physical Exam  Constitutional: He appears well-developed and well-nourished. No distress.  HENT:  Head: Normocephalic and atraumatic.  Eyes: Pupils are equal, round, and reactive to light.  Neck: Normal range of motion. Neck supple.  Cardiovascular: Normal rate and regular rhythm.   Pulmonary/Chest: Effort normal.  Abdominal: Soft.  Musculoskeletal:       Right foot: There is decreased range of motion (due to pain), tenderness (with ecchymosis at the base of the right great toe), bony tenderness and swelling. There is normal capillary refill, no crepitus, no deformity and no laceration.  NIV intact.  Neurological: He is alert.  Skin: Skin is warm and dry.  Nursing note and vitals reviewed.    ED Treatments / Results  Labs (all labs ordered are listed, but only abnormal results are displayed) Labs Reviewed - No data to display  EKG  EKG Interpretation None  Radiology Dg Foot Complete Right  Result Date: 12/15/2016 CLINICAL DATA:  Right foot pain since blunt trauma this morning. EXAM: RIGHT FOOT COMPLETE - 3+ VIEW COMPARISON:  None. FINDINGS: There is no evidence of fracture or dislocation. Slight spurring at the calcaneocuboid joint. Soft tissues are unremarkable. IMPRESSION: No significant abnormalities. Electronically Signed   By: Francene Boyers M.D.   On: 12/15/2016 15:08    Procedures Procedures (including critical care time)  Medications Ordered in ED Medications  ibuprofen (ADVIL,MOTRIN) tablet 800 mg (not administered)     Initial Impression / Assessment and Plan / ED Course  I have  reviewed the triage vital signs and the nursing notes.  Pertinent labs & imaging results that were available during my care of the patient were reviewed by me and considered in my medical decision making (see chart for details).     Patient requesting pain medication, will give a dose of Ibuprofen. I do not feel comfortable to Rx or provide narcotic dose today because he seems already intoxicated-- he denies and says he is just tired but its difficult to tell.  Post op boot, crutches, ortho referral. Rest, ice, elevation  Blood pressure (!) 150/60, pulse 99, temperature 98 F (36.7 C), temperature source Oral, resp. rate 16, SpO2 100 %.  Daniel Downs has been evaluated today in the emergency department. The appropriate screening and testing was been performed and I believe the patient to be medically stable for discharge.   Return signs and symptoms have been discussed with the patient and/or caregivers and they have voiced their understanding. The patient has agreed to follow-up with their primary care provider or the referred specialist.     Final Clinical Impressions(s) / ED Diagnoses   Final diagnoses:  Contusion of right foot, initial encounter    New Prescriptions New Prescriptions   No medications on file     Daniel Pel, PA-C 12/15/16 1658    Daniel Grizzle, MD 12/17/16 [phone]

## 2016-12-15 NOTE — ED Triage Notes (Signed)
Pt was moving a file cabinet this morning and dropped it on his right foot, and now having right foot pain.

## 2016-12-15 NOTE — ED Notes (Signed)
Called pt in waiting room, no answer

## 2016-12-15 NOTE — ED Provider Notes (Signed)
I went to the patient room and no patient present. Per RN review, pt called in waiting room with no answer. As its towards the end of my shift, I will unsign my name as a member of this patients care team.     Marlon Pel, PA-C 12/15/16 1634    Margarita Grizzle, MD 12/17/16 [phone]

## 2018-03-27 IMAGING — DX DG FOOT COMPLETE 3+V*R*
3 series · 3 of 3 positions shown · non-contrast
Comparison: None.

CLINICAL DATA: Right foot pain since blunt trauma this morning.

EXAM:
RIGHT FOOT COMPLETE - 3+ VIEW

[x foot ap right]
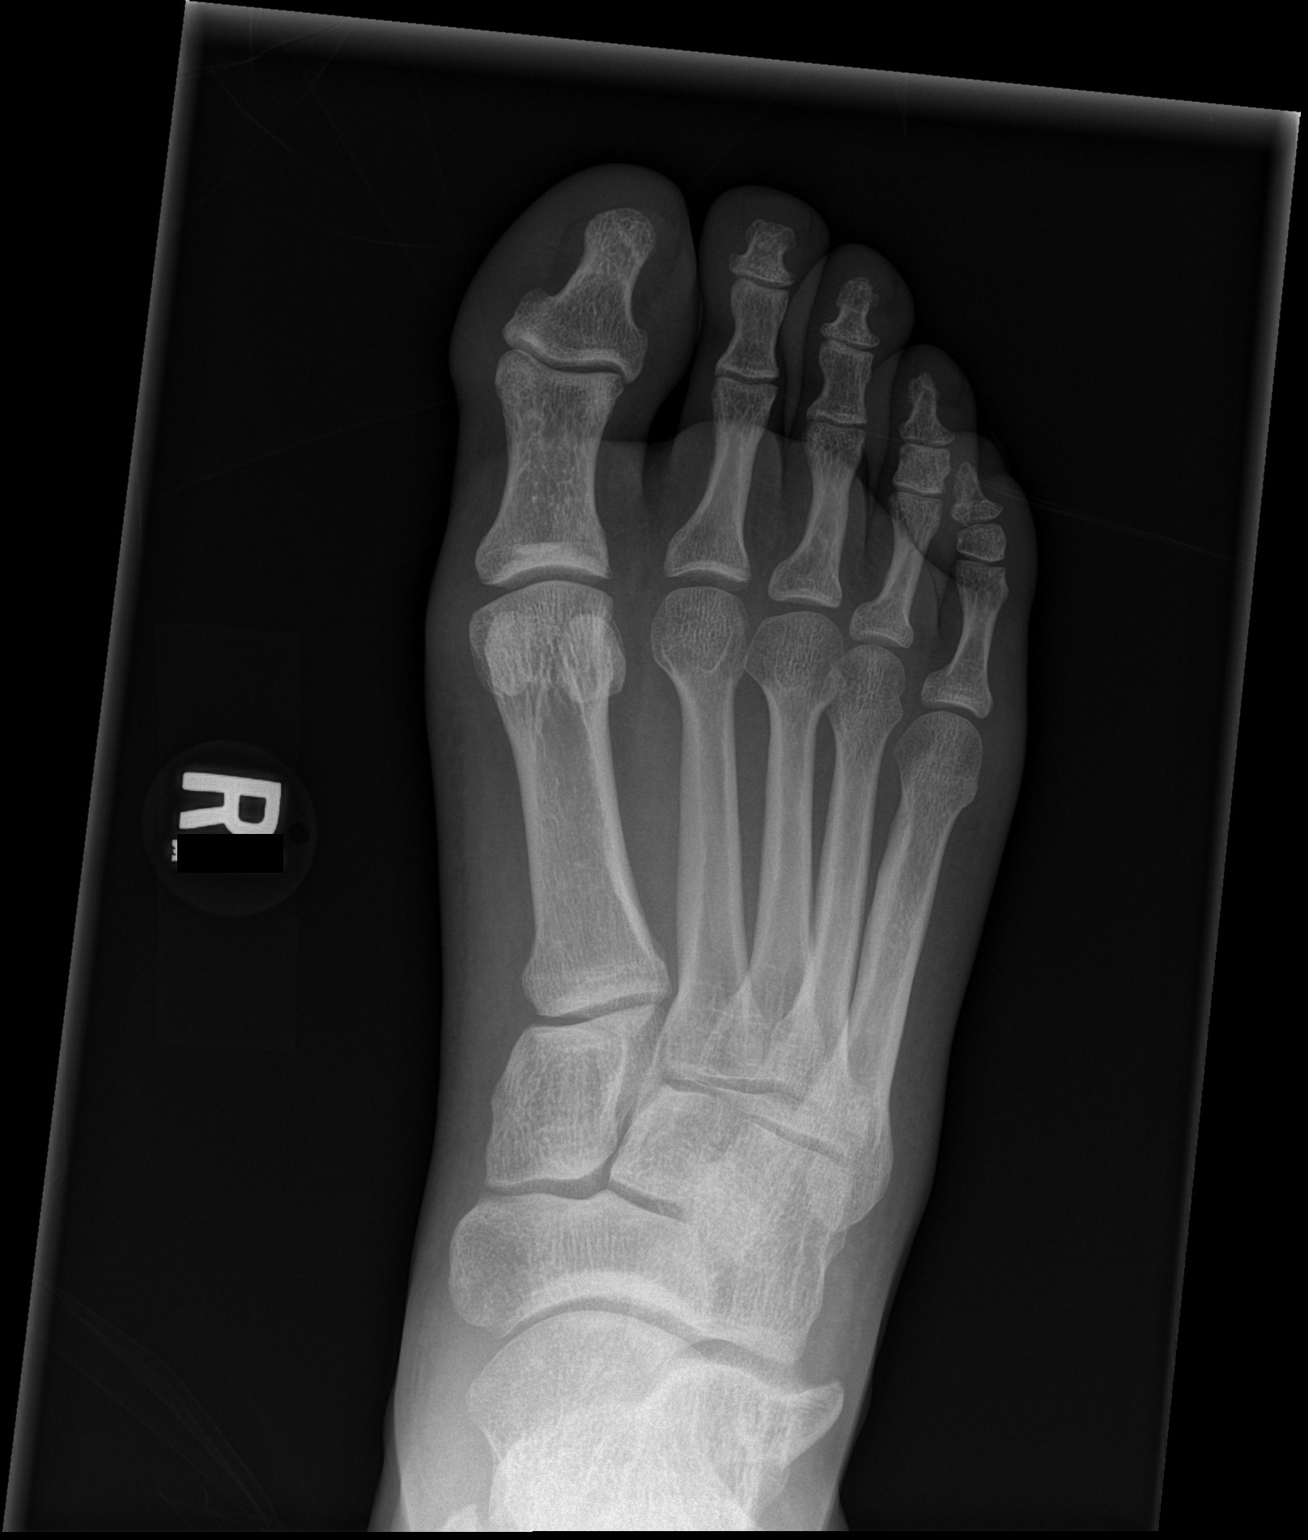

[x foot obl right]
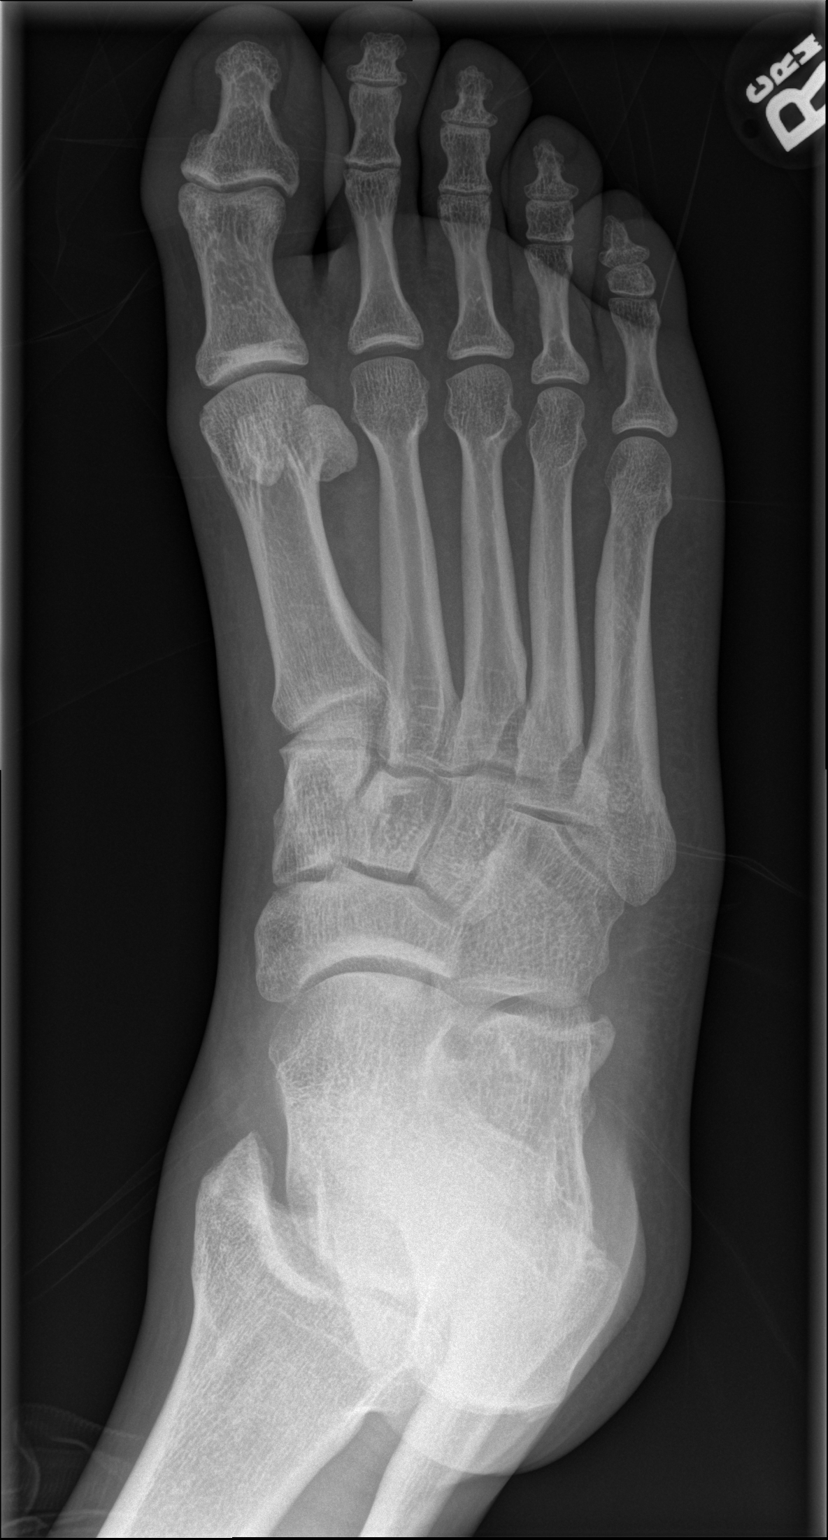

[x foot lat right]
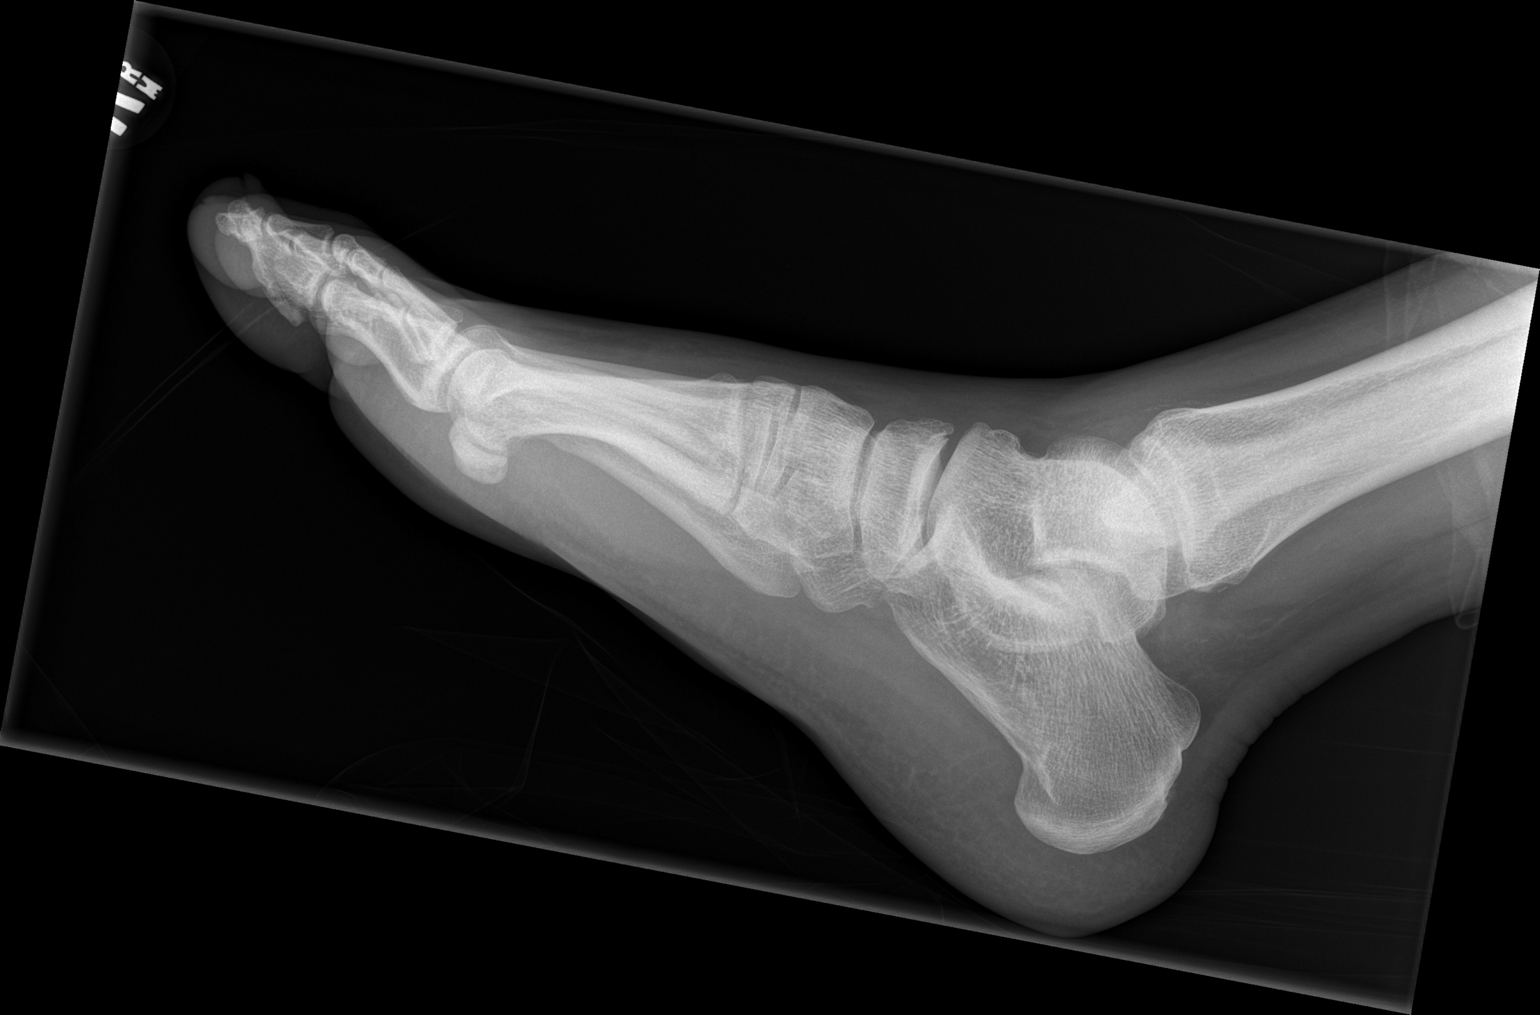

[3 of 3 positions shown; findings below may reference images not displayed]

FINDINGS: There is no evidence of fracture or dislocation. Slight spurring at
the calcaneocuboid joint. Soft tissues are unremarkable.
IMPRESSION: No significant abnormalities.
